# Patient Record
Sex: Male | Born: 1950 | Race: Black or African American | Hispanic: No | Marital: Married | Smoking: Never smoker
Health system: Southern US, Community
[De-identification: ages and names within clinical notes are randomized; demographics above are authoritative.]

## PROBLEM LIST (undated history)

## (undated) DIAGNOSIS — E669 Obesity, unspecified: Secondary | ICD-10-CM

## (undated) DIAGNOSIS — I10 Essential (primary) hypertension: Secondary | ICD-10-CM

## (undated) DIAGNOSIS — I2510 Atherosclerotic heart disease of native coronary artery without angina pectoris: Secondary | ICD-10-CM

## (undated) HISTORY — PX: OTHER SURGICAL HISTORY: SHX169

## (undated) HISTORY — DX: Essential (primary) hypertension: I10

## (undated) HISTORY — DX: Atherosclerotic heart disease of native coronary artery without angina pectoris: I25.10

## (undated) HISTORY — DX: Obesity, unspecified: E66.9

---

## 1998-07-14 ENCOUNTER — Ambulatory Visit (HOSPITAL_BASED_OUTPATIENT_CLINIC_OR_DEPARTMENT_OTHER): Admission: RE | Admit: 1998-07-14 | Discharge: 1998-07-14 | Payer: Self-pay | Admitting: *Deleted

## 2013-08-23 DIAGNOSIS — I10 Essential (primary) hypertension: Secondary | ICD-10-CM

## 2013-08-23 HISTORY — DX: Essential (primary) hypertension: I10

## 2015-07-07 ENCOUNTER — Encounter: Payer: BLUE CROSS/BLUE SHIELD | Attending: Family Medicine | Admitting: Dietician

## 2015-07-07 ENCOUNTER — Encounter: Payer: Self-pay | Admitting: Dietician

## 2015-07-07 VITALS — Ht 70.5 in | Wt 250.0 lb

## 2015-07-07 DIAGNOSIS — E669 Obesity, unspecified: Secondary | ICD-10-CM | POA: Insufficient documentation

## 2015-07-07 DIAGNOSIS — Z6835 Body mass index (BMI) 35.0-35.9, adult: Secondary | ICD-10-CM | POA: Diagnosis not present

## 2015-07-07 DIAGNOSIS — I10 Essential (primary) hypertension: Secondary | ICD-10-CM

## 2015-07-07 DIAGNOSIS — Z713 Dietary counseling and surveillance: Secondary | ICD-10-CM | POA: Diagnosis not present

## 2015-07-07 NOTE — Progress Notes (Signed)
  Medical Nutrition Therapy:  Appt start time: 0815 end time:  0915.   Assessment:  Primary concerns today: Patient is here alone.  He would like to learn how to control eating habits and live a healthier lifestyle.  Wife has diabetes and she speaks of how much "junk" food he eats.  Hx includes HTN.  He is at the highest weight and has increased over the last 3 years.  He is concerned because many of his relatives have diabetes and have lost multiple limbs.  He lives with his wife.  Patient shops and wife cooks.  Eats out about once a week.   He works for [employer] (first shift).  Preferred Learning Style:   No preference indicated   Learning Readiness:   Ready  MEDICATIONS: see list   DIETARY INTAKE: Wife tries to have patient eat like her.  Wife follows a diabetic diet.  She does not cook with salt, he uses "No Salt" with meals.  He buys canned foods.  He uses process meat, bologna, hot dogs, ham, spam.  No longer drinks soda except once a month.  24-hr recall:  B ( AM): Sausage or bacon and egg, cheese sandwich on [employer] with juice OR 1/2 pack NABS, and yogurt, juice  Snk ( AM):  L ( PM):  Fruit, carrots or celery with a lot of ranch or [place], IF he does NOT eat breakfast he has: hamburger with mayo or pimento cheese or fried bologna or PB&Jsandwich OR leftover chicken with a lot of dressing Snk ( PM):  Chips or cookies OR rare honeybun D ( PM):  Fish or chicken (baked- rare fried), salad or cooked vegetables, sweet potato with sugar and cinnamon or white potato "loaded" Snk ( PM): fruit  Beverages: water, OJ and grape juice, "a lot" of sweet tea, beer (1-2/week), wine (2 per week end- does not drink on the weekend)  Usual physical activity: Used to be very active but in the past 2-3 years has not been exercising.  He does his own yard work.  Estimated energy needs: 1600 calories 90 g protein  Progress Towards Goal(s):  In progress.   Nutritional Diagnosis:  NB-1.1  Food and nutrition-related knowledge deficit As related to healthy eating for weight reduction.  As evidenced by patient report.    Intervention:  Nutrition counseling/education on the DASH diet.  Tips provided for weight loss.  Be mindful of what you drink. Continue to be mindful when you eat.  "Am I hungry?"  "When am I full?" Breakfast, lunch, dinner daily. Smaller portions of meats and increased portions of vegetables. Be mindful to read labels and buy more unprocessed meat,fresh, and frozen foods. Snack options:  Low sodium popcorn, unsalted pretzels, almonds or other unsalted nut (1 oz), fruit Consider making a vegetable dip using plain greek yogurt and Mrs. Dash or onion powder, garlic powder, dill. Use only small amounts of regular dressing.  "be a spreader and not a glopper" At least 30 minutes of exercise most days of the week.  Teaching Method Utilized:  Visual Auditory  Handouts given during visit include:  My plate  Snack list  HTN/Dash diet  Barriers to learning/adherence to lifestyle change: none  Demonstrated degree of understanding via:  Teach Back   Monitoring/Evaluation:  Dietary intake, exercise, label reading, and body weight in 2 month(s).

## 2015-07-07 NOTE — Patient Instructions (Signed)
Be mindful of what you drink. Continue to be mindful when you eat.  "Am I hungry?"  "When am I full?" Breakfast, lunch, dinner daily. Smaller portions of meats and increased portions of vegetables. Be mindful to read labels and buy more unprocessed meat,fresh, and frozen foods. Snack options:  Low sodium popcorn, unsalted pretzels, almonds or other unsalted nut (1 oz), fruit Consider making a vegetable dip using plain greek yogurt and Mrs. Dash or onion powder, garlic powder, dill. Use only small amounts of regular dressing.  "be a spreader and not a glopper" At least 30 minutes of exercise most days of the week.

## 2015-09-08 ENCOUNTER — Ambulatory Visit: Payer: BLUE CROSS/BLUE SHIELD | Admitting: Dietician

## 2016-06-08 ENCOUNTER — Encounter: Payer: Self-pay | Admitting: Dietician

## 2016-06-08 ENCOUNTER — Encounter: Payer: BLUE CROSS/BLUE SHIELD | Attending: Family Medicine | Admitting: Dietician

## 2016-06-08 DIAGNOSIS — E6609 Other obesity due to excess calories: Secondary | ICD-10-CM

## 2016-06-08 DIAGNOSIS — I10 Essential (primary) hypertension: Secondary | ICD-10-CM | POA: Diagnosis not present

## 2016-06-08 DIAGNOSIS — Z6835 Body mass index (BMI) 35.0-35.9, adult: Secondary | ICD-10-CM

## 2016-06-08 NOTE — Progress Notes (Signed)
Medical Nutrition Therapy:  Appt start time: 1540 end time:  1610.   Assessment: 07/07/2015  Primary concerns today: Patient is here alone.  He would like to learn how to control eating habits and live a healthier lifestyle.  Wife has diabetes and she speaks of how much "junk" food he eats.  Hx includes HTN.  He is at the highest weight and has increased over the last 3 years.  He is concerned because many of his relatives have diabetes and have lost multiple limbs.  Weight 240 lbs  He lives with his wife.  Patient shops and wife cooks.  Eats out about once a week.   He works for [employer] (first shift).  06/08/16: Patient is here alone.  I saw him last about one month ago.  He did not come to the follow up scheduled after that appointment.  He states that he had been doing very well and lost 10 lbs but reverted to old habits and regained the weight.  Weight today is 248 lbs.  He states that he was eating more fruits and vegetables and had more energy.  He was also exercising regularly at that time.    Preferred Learning Style:   No preference indicated   Learning Readiness:   Ready  MEDICATIONS: see list   DIETARY INTAKE: Wife tries to have patient eat like her.  Wife follows a diabetic diet.  She does not cook with salt, he uses "No Salt" with meals.  He buys canned foods.  He uses process meat, bologna, hot dogs, ham, spam.  No longer drinks soda except once a month.  24-hr recall:  B (8:30-9:30 AM): yogurt and fruit or Sausage and egg Sandwich on [employer] bread Snk ( AM):  L ( PM):  Snacks on chips or cookies or other things that he brings from Snk ( PM):  [employer] burger and fries or sandwich or yogurt D ( PM):  Baked meat or occasional fried fish, sweet potato, vegetables OR weekends out to eat at [employer] Snk ( PM): fruit or yogurt or sherbet Beverages: water, OJ and grape juice, "a lot" of sweet tea, beer (1-2/week), wine (2 per week end- does not drink on the  weekend)  Usual physical activity: Used to be very active but in the past 2-3 years has not been exercising.  He does his own yard work.  He was more active after last appointment but did not continue.  He and his wife are now considering joining the [facility].  Estimated energy needs: 1600 calories 90 g protein  Progress Towards Goal(s):  In progress.   Nutritional Diagnosis:  NB-1.1 Food and nutrition-related knowledge deficit As related to healthy eating for weight reduction.  As evidenced by patient report.    Intervention:  Nutrition counseling/education on healthy eating.  Tips provided for weight loss.  Rethink what you drink.  Limit juice and sweet tea. Be sure to drink enough water.  Drink 1 bottle of water before work.  Aim for 2-3 bottles of water at work. Be mindful about your food choices.    "Am I hungry?"  "When am I full?" Eat slowly. Serve a small portion to begin with.  Finish this, wait 10 minutes and go back for seconds if you want. Watch the portion of salad dressing.  "Be a spreader not a glopper." Snack options:  Low sodium popcorn, unsalted pretzels, almonds or other unsalted nut (1 oz), fruit, raw veges, or protein bar Resume exercise.  Aim for 30 minutes most days of the week.  Teaching Method Utilized:  Visual Auditory  Handouts given during initial visit include:  My plate  Snack list  HTN/Dash diet  Barriers to learning/adherence to lifestyle change: none  Demonstrated degree of understanding via:  Teach Back   Monitoring/Evaluation:  Dietary intake, exercise, label reading, and body weight 6 months.

## 2016-06-08 NOTE — Patient Instructions (Addendum)
Rethink what you drink.  Limit juice and sweet tea. Be sure to drink enough water.  Drink 1 bottle of water before work.  Aim for 2-3 bottles of water at work. Be mindful about your food choices.    "Am I hungry?"  "When am I full?" Eat slowly. Serve a small portion to begin with.  Finish this, wait 10 minutes and go back for seconds if you want. Watch the portion of salad dressing.  "Be a spreader not a glopper." Snack options:  Low sodium popcorn, unsalted pretzels, almonds or other unsalted nut (1 oz), fruit, raw veges, or protein bar Resume exercise.  Aim for 30 minutes most days of the week.

## 2016-11-09 ENCOUNTER — Ambulatory Visit: Payer: BLUE CROSS/BLUE SHIELD | Admitting: Dietician

## 2018-06-01 DIAGNOSIS — Z23 Encounter for immunization: Secondary | ICD-10-CM | POA: Diagnosis not present

## 2018-08-28 DIAGNOSIS — Z136 Encounter for screening for cardiovascular disorders: Secondary | ICD-10-CM | POA: Diagnosis not present

## 2018-08-28 DIAGNOSIS — R7309 Other abnormal glucose: Secondary | ICD-10-CM | POA: Diagnosis not present

## 2018-08-28 DIAGNOSIS — I10 Essential (primary) hypertension: Secondary | ICD-10-CM | POA: Diagnosis not present

## 2018-09-01 DIAGNOSIS — Z125 Encounter for screening for malignant neoplasm of prostate: Secondary | ICD-10-CM | POA: Diagnosis not present

## 2018-09-01 DIAGNOSIS — Z23 Encounter for immunization: Secondary | ICD-10-CM | POA: Diagnosis not present

## 2018-09-01 DIAGNOSIS — R7301 Impaired fasting glucose: Secondary | ICD-10-CM | POA: Diagnosis not present

## 2018-09-01 DIAGNOSIS — N529 Male erectile dysfunction, unspecified: Secondary | ICD-10-CM | POA: Diagnosis not present

## 2018-09-01 DIAGNOSIS — Z0000 Encounter for general adult medical examination without abnormal findings: Secondary | ICD-10-CM | POA: Diagnosis not present

## 2018-09-01 DIAGNOSIS — Z1211 Encounter for screening for malignant neoplasm of colon: Secondary | ICD-10-CM | POA: Diagnosis not present

## 2018-09-01 DIAGNOSIS — D508 Other iron deficiency anemias: Secondary | ICD-10-CM | POA: Diagnosis not present

## 2018-09-01 DIAGNOSIS — I10 Essential (primary) hypertension: Secondary | ICD-10-CM | POA: Diagnosis not present

## 2018-09-11 DIAGNOSIS — R197 Diarrhea, unspecified: Secondary | ICD-10-CM | POA: Diagnosis not present

## 2018-09-11 DIAGNOSIS — I10 Essential (primary) hypertension: Secondary | ICD-10-CM | POA: Diagnosis not present

## 2018-09-11 DIAGNOSIS — Z86010 Personal history of colonic polyps: Secondary | ICD-10-CM | POA: Diagnosis not present

## 2018-09-29 DIAGNOSIS — D508 Other iron deficiency anemias: Secondary | ICD-10-CM | POA: Diagnosis not present

## 2018-09-29 DIAGNOSIS — R7301 Impaired fasting glucose: Secondary | ICD-10-CM | POA: Diagnosis not present

## 2018-09-29 DIAGNOSIS — Z125 Encounter for screening for malignant neoplasm of prostate: Secondary | ICD-10-CM | POA: Diagnosis not present

## 2018-09-29 DIAGNOSIS — Z0000 Encounter for general adult medical examination without abnormal findings: Secondary | ICD-10-CM | POA: Diagnosis not present

## 2018-10-03 DIAGNOSIS — Z86010 Personal history of colonic polyps: Secondary | ICD-10-CM | POA: Diagnosis not present

## 2018-11-09 ENCOUNTER — Encounter: Payer: Self-pay | Admitting: Internal Medicine

## 2018-11-09 ENCOUNTER — Telehealth: Payer: Self-pay | Admitting: Internal Medicine

## 2018-11-09 NOTE — Telephone Encounter (Signed)
A new hem appt has been scheduled for the pt to see Dr. Melton Alar on 3/31 at 1pm. Pt aware to arrive 15 minutes early. Letter mailed.

## 2018-11-21 ENCOUNTER — Encounter: Payer: BLUE CROSS/BLUE SHIELD | Admitting: Internal Medicine

## 2019-02-15 ENCOUNTER — Encounter (HOSPITAL_COMMUNITY): Payer: Self-pay

## 2019-02-15 ENCOUNTER — Other Ambulatory Visit: Payer: Self-pay

## 2019-02-15 ENCOUNTER — Emergency Department (HOSPITAL_COMMUNITY): Payer: Medicare HMO

## 2019-02-15 ENCOUNTER — Observation Stay (HOSPITAL_COMMUNITY)
Admission: EM | Admit: 2019-02-15 | Discharge: 2019-02-17 | Disposition: A | Discharge status: Home / Self Care | Payer: Medicare HMO | Attending: Cardiology | Admitting: Cardiology

## 2019-02-15 DIAGNOSIS — I214 Non-ST elevation (NSTEMI) myocardial infarction: Secondary | ICD-10-CM | POA: Diagnosis not present

## 2019-02-15 DIAGNOSIS — Z955 Presence of coronary angioplasty implant and graft: Secondary | ICD-10-CM

## 2019-02-15 DIAGNOSIS — I249 Acute ischemic heart disease, unspecified: Secondary | ICD-10-CM | POA: Diagnosis not present

## 2019-02-15 DIAGNOSIS — R778 Other specified abnormalities of plasma proteins: Secondary | ICD-10-CM

## 2019-02-15 DIAGNOSIS — R7303 Prediabetes: Secondary | ICD-10-CM | POA: Diagnosis present

## 2019-02-15 DIAGNOSIS — I10 Essential (primary) hypertension: Secondary | ICD-10-CM | POA: Diagnosis present

## 2019-02-15 DIAGNOSIS — E669 Obesity, unspecified: Secondary | ICD-10-CM | POA: Diagnosis not present

## 2019-02-15 DIAGNOSIS — R079 Chest pain, unspecified: Secondary | ICD-10-CM

## 2019-02-15 DIAGNOSIS — Z6832 Body mass index (BMI) 32.0-32.9, adult: Secondary | ICD-10-CM | POA: Insufficient documentation

## 2019-02-15 DIAGNOSIS — Z79899 Other long term (current) drug therapy: Secondary | ICD-10-CM | POA: Diagnosis not present

## 2019-02-15 DIAGNOSIS — Z7982 Long term (current) use of aspirin: Secondary | ICD-10-CM | POA: Insufficient documentation

## 2019-02-15 DIAGNOSIS — Z1159 Encounter for screening for other viral diseases: Secondary | ICD-10-CM | POA: Diagnosis not present

## 2019-02-15 DIAGNOSIS — Z7902 Long term (current) use of antithrombotics/antiplatelets: Secondary | ICD-10-CM | POA: Diagnosis not present

## 2019-02-15 DIAGNOSIS — R7989 Other specified abnormal findings of blood chemistry: Secondary | ICD-10-CM | POA: Diagnosis not present

## 2019-02-15 DIAGNOSIS — Z20828 Contact with and (suspected) exposure to other viral communicable diseases: Secondary | ICD-10-CM | POA: Diagnosis not present

## 2019-02-15 LAB — BASIC METABOLIC PANEL
Anion gap: 6 (ref 5–15)
BUN: 11 mg/dL (ref 8–23)
CO2: 22 mmol/L (ref 22–32)
Calcium: 8.6 mg/dL — ABNORMAL LOW (ref 8.9–10.3)
Chloride: 108 mmol/L (ref 98–111)
Creatinine, Ser: 0.89 mg/dL (ref 0.61–1.24)
GFR calc Af Amer: >60 mL/min (ref >60)
GFR calc non Af Amer: >60 mL/min (ref >60)
Glucose, Bld: 105 mg/dL — ABNORMAL HIGH (ref 70–99)
Sodium: 136 mmol/L (ref 135–145)

## 2019-02-15 LAB — CBC
HCT: 50.1 % (ref 39.0–52.0)
Hemoglobin: 14.7 g/dL (ref 13.0–17.0)
MCH: 24 pg — ABNORMAL LOW (ref 26.0–34.0)
MCHC: 29.3 g/dL — ABNORMAL LOW (ref 30.0–36.0)
MCV: 81.9 fL (ref 80.0–100.0)
Platelets: 236 10*3/uL (ref 150–400)
RBC: 6.12 MIL/uL — ABNORMAL HIGH (ref 4.22–5.81)
RDW: 16.1 % — ABNORMAL HIGH (ref 11.5–15.5)
WBC: 12.6 10*3/uL — ABNORMAL HIGH (ref 4.0–10.5)
nRBC: 0 % (ref 0.0–0.2)

## 2019-02-15 LAB — MAGNESIUM: Magnesium: 1.9 mg/dL (ref 1.7–2.4)

## 2019-02-15 LAB — CREATININE, SERUM
Creatinine, Ser: 0.66 mg/dL (ref 0.61–1.24)
GFR calc Af Amer: >60 mL/min (ref >60)
GFR calc non Af Amer: >60 mL/min (ref >60)

## 2019-02-15 LAB — TSH: TSH: 1.05 u[IU]/mL (ref 0.350–4.500)

## 2019-02-15 LAB — TROPONIN I (HIGH SENSITIVITY)
Troponin I (High Sensitivity): 132 ng/L (ref <18)
Troponin I (High Sensitivity): 364 ng/L (ref <18)
Troponin I (High Sensitivity): 773 ng/L (ref <18)

## 2019-02-15 MED ORDER — ENOXAPARIN SODIUM 40 MG/0.4ML ~~LOC~~ SOLN: 40.0000 mg | SUBCUTANEOUS | Status: DC

## 2019-02-15 MED ORDER — NITROGLYCERIN 0.4 MG SL SUBL
0.4000 mg | SUBLINGUAL_TABLET | SUBLINGUAL | Status: DC | PRN
  Administered 2019-02-15 (×2): 0.4 mg via SUBLINGUAL
  Filled 2019-02-15: qty 1

## 2019-02-15 MED ORDER — AMLODIPINE BESYLATE 5 MG PO TABS
5.0000 mg | ORAL_TABLET | Freq: Every day | ORAL | Status: DC
  Administered 2019-02-16 – 2019-02-17 (×2): 5 mg via ORAL
  Filled 2019-02-15 (×2): qty 1

## 2019-02-15 MED ORDER — HYDRALAZINE HCL 20 MG/ML IJ SOLN: 10.0000 mg | Freq: Four times a day (QID) | INTRAMUSCULAR | Status: DC | PRN

## 2019-02-15 MED ORDER — ASPIRIN 300 MG RE SUPP: 300.0000 mg | RECTAL | Status: DC

## 2019-02-15 MED ORDER — ACETAMINOPHEN 325 MG PO TABS
650.0000 mg | ORAL_TABLET | ORAL | Status: DC | PRN
  Administered 2019-02-15: 650 mg via ORAL
  Filled 2019-02-15: qty 2

## 2019-02-15 MED ORDER — ATORVASTATIN CALCIUM 40 MG PO TABS
40.0000 mg | ORAL_TABLET | Freq: Every day | ORAL | Status: DC
  Administered 2019-02-16 (×2): 40 mg via ORAL
  Filled 2019-02-15 (×3): qty 1

## 2019-02-15 MED ORDER — HEPARIN (PORCINE) 25000 UT/250ML-% IV SOLN
1200.0000 [IU]/h | INTRAVENOUS | Status: DC
  Filled 2019-02-15: qty 250

## 2019-02-15 MED ORDER — ASPIRIN EC 81 MG PO TBEC
81.0000 mg | DELAYED_RELEASE_TABLET | Freq: Every day | ORAL | Status: DC
  Administered 2019-02-16 – 2019-02-17 (×2): 81 mg via ORAL
  Filled 2019-02-15 (×3): qty 1

## 2019-02-15 MED ORDER — ASPIRIN 81 MG PO CHEW: 324.0000 mg | CHEWABLE_TABLET | ORAL | Status: DC

## 2019-02-15 MED ORDER — ZOLPIDEM TARTRATE 5 MG PO TABS: 5.0000 mg | ORAL_TABLET | Freq: Every evening | ORAL | Status: DC | PRN

## 2019-02-15 MED ORDER — LIDOCAINE VISCOUS HCL 2 % MT SOLN
15.0000 mL | Freq: Once | OROMUCOSAL | Status: AC
  Administered 2019-02-15: 12:00:00 15 mL via ORAL
  Filled 2019-02-15: qty 15

## 2019-02-15 MED ORDER — IBUPROFEN 200 MG PO TABS
600.0000 mg | ORAL_TABLET | Freq: Once | ORAL | Status: AC
  Administered 2019-02-15: 600 mg via ORAL
  Filled 2019-02-15: qty 3

## 2019-02-15 MED ORDER — ALUM & MAG HYDROXIDE-SIMETH 200-200-20 MG/5ML PO SUSP
30.0000 mL | Freq: Once | ORAL | Status: AC
  Administered 2019-02-15: 12:00:00 30 mL via ORAL
  Filled 2019-02-15: qty 30

## 2019-02-15 MED ORDER — FAMOTIDINE IN NACL 20-0.9 MG/50ML-% IV SOLN
20.0000 mg | Freq: Once | INTRAVENOUS | Status: AC
  Administered 2019-02-15: 20 mg via INTRAVENOUS
  Filled 2019-02-15: qty 50

## 2019-02-15 MED ORDER — SODIUM CHLORIDE 0.9% FLUSH
3.0000 mL | Freq: Once | INTRAVENOUS | Status: AC
  Administered 2019-02-15: 3 mL via INTRAVENOUS

## 2019-02-15 MED ORDER — ASPIRIN 81 MG PO CHEW
324.0000 mg | CHEWABLE_TABLET | Freq: Once | ORAL | Status: AC
  Administered 2019-02-15: 14:00:00 324 mg via ORAL
  Filled 2019-02-15: qty 4

## 2019-02-15 MED ORDER — HEPARIN BOLUS VIA INFUSION
4000.0000 [IU] | Freq: Once | INTRAVENOUS | Status: AC
  Administered 2019-02-15: 4000 [IU] via INTRAVENOUS
  Filled 2019-02-15: qty 4000

## 2019-02-15 MED ORDER — ONDANSETRON HCL 4 MG/2ML IJ SOLN
4.0000 mg | Freq: Four times a day (QID) | INTRAMUSCULAR | Status: DC | PRN
  Filled 2019-02-15: qty 2

## 2019-02-15 NOTE — ED Notes (Signed)
ED TO INPATIENT HANDOFF REPORT  ED Nurse Name and Phone #:  Darnell LevelMandi  S Name/Age/Gender Bruce LocksHorace W Spencer 68 y.o. male Room/Bed: WA19/WA19  Code Status   Code Status: Full Code  Home/SNF/Other Home Patient oriented to: self, place, time and situation Is this baseline? Yes   Triage Complete: Triage complete  Chief Complaint chest pain  Triage Note Pt states upper chest pains since yesterday. Pt the pain as nagging.    Allergies No Known Allergies  Level of Care/Admitting Diagnosis ED Disposition    ED Disposition Condition Comment   Admit  [facility] [[id number]]  Level of Care: Telemetry [5]  Admit to tele based on following criteria: Other see comments  Comments: chest pain  Covid Evaluation: N/A  Diagnosis: Chest pain at rest [[id number]][[id number]]  Admitting Physician: Hughie ClossPAHWANI, RAVI [[id number]]  Attending Physician: Hughie ClossPAHWANI, RAVI [[id number]]  PT Class (Do Not Modify): Observation [104]  PT Acc Code (Do Not Modify): Observation [10022]       B Medical/Surgery History Past Medical History:  Diagnosis Date  . Hypertension   . Obesity    History reviewed. No pertinent surgical history.   A IV Location/Drains/Wounds Patient Lines/Drains/Airways Status   Active Line/Drains/Airways    Name:   Placement date:   Placement time:   Site:   Days:   Peripheral IV 02/15/19 Right Hand   02/15/19    1036    Hand   less than 1          Intake/Output Last 24 hours No intake or output data in the 24 hours ending 02/15/19 1616  Labs/Imaging Results for orders placed or performed during the hospital encounter of 02/15/19 (from the past 48 hour(s))  CBC     Status: Abnormal   Collection Time: 02/15/19  9:47 AM  Result Value Ref Range   WBC 12.6 (H) 4.0 - 10.5 K/uL   RBC 6.12 (H) 4.22 - 5.81 MIL/uL   Hemoglobin 14.7 13.0 - 17.0 g/dL   HCT 29.550.1 62.139.0 - 30.852.0 %   MCV 81.9 80.0 - 100.0 fL   MCH 24.0 (L) 26.0 - 34.0 pg   MCHC 29.3 (L) 30.0 - 36.0 g/dL   RDW 65.716.1 (H) 84.611.5 - 96.215.5 %   Platelets 236 150 - 400 K/uL   nRBC 0.0 0.0 - 0.2 %    Comment: Performed at [facility], [address] [place], [place] [address]  Basic metabolic panel     Status: Abnormal   Collection Time: 02/15/19 11:00 AM  Result Value Ref Range   Sodium 136 135 - 145 mmol/L   Potassium  3.5 - 5.1 mmol/L    SPECIMEN HEMOLYZED. HEMOLYSIS MAY AFFECT INTEGRITY OF RESULTS.    Comment: ACCURACY OF RESULTS QUESTIONABLE. RECOMMEND RECOLLECT TO VERIFY.   Chloride 108 98 - 111 mmol/L   CO2 22 22 - 32 mmol/L   Glucose, Bld 105 (H) 70 - 99 mg/dL   BUN 11 8 - 23 mg/dL   Creatinine, Ser 1.320.89 0.61 - 1.24 mg/dL   Calcium 8.6 (L) 8.9 - 10.3 mg/dL   GFR calc non Af Amer >60 >60 mL/min   GFR calc Af Amer >60 >60 mL/min   Anion gap 6 5 - 15    Comment: Performed at [name], [address] [place], [place] [address]  Troponin I (High Sensitivity)     Status: Abnormal   Collection Time: 02/15/19 11:00 AM  Result Value Ref Range  Troponin I (High Sensitivity) 132 (HH) <18 ng/L    Comment: CRITICAL RESULT CALLED TO, READ BACK BY AND VERIFIED WITH: SMITH,T. RN AT 1335 02/15/19 MULLINS,T (NOTE) Elevated high sensitivity troponin I (hsTnI) values and significant  changes across serial measurements may suggest ACS but many other  chronic and acute conditions are known to elevate hsTnI results.  Refer to the Links section for chest pain algorithms and additional  guidance. Performed at [facility], [place] [address] [place], [place] [address]   Troponin I (High Sensitivity)     Status: Abnormal   Collection Time: 02/15/19  1:57 PM  Result Value Ref Range   Troponin I (High Sensitivity) 364.0 (HH) <18 ng/L    Comment: DELTA CHECK NOTED QUANTITY NOT SUFFICIENT TO REPEAT TEST CRITICAL VALUE NOTED.  VALUE IS CONSISTENT WITH PREVIOUSLY REPORTED AND CALLED VALUE. (NOTE) Elevated high sensitivity troponin I (hsTnI) values  and significant  changes across serial measurements may suggest ACS but many other  chronic and acute conditions are known to elevate hsTnI results.  Refer to the Links section for chest pain algorithms and additional  guidance. Performed at [facility], [place] [address], [place], [place] [address]    Dg Chest 2 View  Result Date: 02/15/2019 CLINICAL DATA:  New onset chest pain EXAM: CHEST - 2 VIEW COMPARISON:  09/07/2011 FINDINGS: Normal heart size and mediastinal contours. No acute infiltrate or edema. No effusion or pneumothorax. No acute osseous findings. IMPRESSION: No active cardiopulmonary disease. Electronically Signed   By: Monte Fantasia M.D.   On: 02/15/2019 11:17    Pending Labs Unresulted Labs (From admission, onward)    Start     Ordered   02/22/19 0500  Creatinine, serum  (enoxaparin (LOVENOX)    CrCl >/= 30 ml/min)  Weekly,   R    Comments: while on enoxaparin therapy    02/15/19 1557   02/16/19 2778  Basic metabolic panel  Tomorrow morning,   R     02/15/19 1557   02/16/19 0500  Lipid panel  Tomorrow morning,   R     02/15/19 1557   02/15/19 1602  Magnesium  Add-on,   AD     02/15/19 1605   02/15/19 1601  Creatinine, serum  (enoxaparin (LOVENOX)    CrCl >/= 30 ml/min)  Add-on,   AD    Comments: Baseline for enoxaparin therapy IF NOT ALREADY DRAWN.    02/15/19 1605   02/15/19 1557  HIV antibody (Routine Testing)  Once,   STAT     02/15/19 1557   02/15/19 1557  TSH  Once,   STAT     02/15/19 1557   02/15/19 1557  Troponin I (High Sensitivity)  STAT Now then every 2 hours,   STAT     02/15/19 1557   02/15/19 1405  Novel Coronavirus,NAA,(SEND-OUT TO REF LAB - TAT 24-48 hrs); Hosp Order  (Asymptomatic Patients Labs)  Once,   STAT    Question:  Rule Out  Answer:  Yes   02/15/19 1404          Vitals/Pain Today's Vitals   02/15/19 1530 02/15/19 1544 02/15/19 1545 02/15/19 1615  BP: (!) 169/93 (!) 169/93    Pulse: 68 67 72 74  Resp: 15 16  16  (!) 22  Temp:      TempSrc:      SpO2: 98% 97% 99% 97%  Weight:      Height:      PainSc:  Isolation Precautions No active isolations  Medications Medications  amLODipine (NORVASC) tablet 5 mg (5 mg Oral Not Given 02/15/19 1614)  aspirin chewable tablet 324 mg (324 mg Oral Not Given 02/15/19 1612)    Or  aspirin suppository 300 mg ( Rectal See Alternative 02/15/19 1612)  aspirin EC tablet 81 mg (has no administration in time range)  nitroGLYCERIN (NITROSTAT) SL tablet 0.4 mg (0.4 mg Sublingual Given 02/15/19 1616)  acetaminophen (TYLENOL) tablet 650 mg (650 mg Oral Given 02/15/19 1615)  ondansetron (ZOFRAN) injection 4 mg (has no administration in time range)  enoxaparin (LOVENOX) injection 40 mg (has no administration in time range)  atorvastatin (LIPITOR) tablet 40 mg (has no administration in time range)  zolpidem (AMBIEN) tablet 5 mg (has no administration in time range)  hydrALAZINE (APRESOLINE) injection 10 mg (has no administration in time range)  sodium chloride flush (NS) 0.9 % injection 3 mL (3 mLs Intravenous Given 02/15/19 1037)  famotidine (PEPCID) IVPB 20 mg premix (0 mg Intravenous Stopped 02/15/19 1209)  ibuprofen (ADVIL) tablet 600 mg (600 mg Oral Given 02/15/19 1138)  alum & mag hydroxide-simeth (MAALOX/MYLANTA) 200-200-20 MG/5ML suspension 30 mL (30 mLs Oral Given 02/15/19 1138)    And  lidocaine (XYLOCAINE) 2 % viscous mouth solution 15 mL (15 mLs Oral Given 02/15/19 1138)  aspirin chewable tablet 324 mg (324 mg Oral Given 02/15/19 1356)    Mobility walks Low fall risk   Focused Assessments Cardiac Assessment Handoff:    No results found for: CKTOTAL, CKMB, CKMBINDEX, TROPONINI No results found for: DDIMER Does the Patient currently have chest pain? Yes      R Recommendations: See Admitting Provider Note  Report given to:   Additional Notes:

## 2019-02-15 NOTE — Progress Notes (Signed)
ANTICOAGULATION CONSULT NOTE - Initial Consult  Pharmacy Consult for IV heparin Indication: chest pain/ACS  No Known Allergies  Patient Measurements: Height: 5\' 11"  (180.3 cm) Weight: 222 lb (100.7 kg) IBW/kg (Calculated) : 75.3 Heparin Dosing Weight: 96.1  Vital Signs: Temp: 97.8 F (36.6 C) (06/25 1000) Temp Source: Oral (06/25 1000) BP: 171/83 (06/25 1730) Pulse Rate: 62 (06/25 1730)  Labs: Recent Labs    02/15/19 0947 02/15/19 1100  HGB 14.7  --   HCT 50.1  --   PLT 236  --   CREATININE  --  0.89    Estimated Creatinine Clearance: 100.1 mL/min (by C-G formula based on SCr of 0.89 mg/dL).   Medical History: Past Medical History:  Diagnosis Date  . Hypertension   . Obesity     Medications:  Scheduled:  . amLODipine  5 mg Oral Daily  . aspirin  324 mg Oral NOW   Or  . aspirin  300 mg Rectal NOW  . [START ON 02/16/2019] aspirin EC  81 mg Oral Daily  . atorvastatin  40 mg Oral q1800  . enoxaparin (LOVENOX) injection  40 mg Subcutaneous Q24H   Infusions:    Assessment: 68 yo male with ACS to start IV heparin per pharmacy dosing. Baseline labs drawn. No anticoag meds being taken PTA  Goal of Therapy:  Heparin level 0.3-0.7 units/ml Monitor platelets by anticoagulation protocol: Yes   Plan:  1) IV heparin 4000 unit bolus then 2) IV heparin infusion rate of 1200 units/hr 3) Check heparin level 6 hours after start of IV heparin 4) Daily CBC and heparin level   Kara Mead 02/15/2019,6:10 PM

## 2019-02-15 NOTE — ED Notes (Signed)
Attempted to call 4W but no answer

## 2019-02-15 NOTE — ED Notes (Signed)
Called 4W and was told to call back in 10 minutes to give report.

## 2019-02-15 NOTE — ED Triage Notes (Signed)
Pt states upper chest pains since yesterday. Pt the pain as nagging.

## 2019-02-15 NOTE — ED Provider Notes (Signed)
[facility]-EMERGENCY DEPT Provider Note   CSN: [id number] Arrival date & time: 02/15/19  [id number]     History   Chief Complaint Chief Complaint  Patient presents with  . Chest Pain    HPI Bruce Spencer is a 68 y.o. male.     HPI   67yM with CP. Describes a "nagging" pain in his upper chest. Onset yesterday. Just thought he strained his chest. Improved but not resolved prior to going to bed. Had again this morning and worsening when went to work so came to the ED. Denies any other symptoms with it. No known CAD. HX of HTN.   Past Medical History:  Diagnosis Date  . Hypertension   . Obesity     There are no active problems to display for this patient.   History reviewed. No pertinent surgical history.      Home Medications    Prior to Admission medications   Medication Sig Start Date End Date Taking? Authorizing Provider  amLODipine (NORVASC) 5 MG tablet Take 5 mg by mouth daily. 01/26/19  Yes [provider]  Multiple Vitamin (MULTIVITAMIN WITH MINERALS) TABS tablet Take 1 tablet by mouth daily.   Yes [provider]  OVER THE COUNTER MEDICATION Take 1 capsule by mouth 3 (three) times a week. Super Beta Prostate   Yes [provider]    Family History No family history on file.  Social History Social History   Tobacco Use  . Smoking status: Never Smoker  . Smokeless tobacco: Never Used  Substance Use Topics  . Alcohol use: Yes    Frequency: Never    Comment: socially  . Drug use: Never     Allergies   Patient has no known allergies.   Review of Systems Review of Systems  All systems reviewed and negative, other than as noted in HPI.  Physical Exam Updated Vital Signs BP (!) 155/87   Pulse 63   Temp 97.8 F (36.6 C) (Oral)   Resp 11   Ht 5\' 11"  (1.803 m)   Wt 100.7 kg   SpO2 99%   BMI 30.96 kg/m   Physical Exam Vitals signs and nursing note reviewed.  Constitutional:      General: He is  not in acute distress.    Appearance: He is well-developed.  HENT:     Head: Normocephalic and atraumatic.  Eyes:     General:        Right eye: No discharge.        Left eye: No discharge.     Conjunctiva/sclera: Conjunctivae normal.  Neck:     Musculoskeletal: Neck supple.  Cardiovascular:     Rate and Rhythm: Normal rate and regular rhythm.     Heart sounds: Normal heart sounds. No murmur. No friction rub. No gallop.   Pulmonary:     Effort: Pulmonary effort is normal. No respiratory distress.     Breath sounds: Normal breath sounds.  Abdominal:     General: There is no distension.     Palpations: Abdomen is soft.     Tenderness: There is no abdominal tenderness.  Musculoskeletal:        General: No tenderness.     Comments: Lower extremities symmetric as compared to each other. No calf tenderness. Negative Homan's. No palpable cords.   Skin:    General: Skin is warm and dry.  Neurological:     Mental Status: He is alert.  Psychiatric:  Behavior: Behavior normal.        Thought Content: Thought content normal.      ED Treatments / Results  Labs (all labs ordered are listed, but only abnormal results are displayed) Labs Reviewed  CBC - Abnormal; Notable for the following components:      Result Value   WBC 12.6 (*)    RBC 6.12 (*)    MCH 24.0 (*)    MCHC 29.3 (*)    RDW 16.1 (*)    All other components within normal limits  BASIC METABOLIC PANEL - Abnormal; Notable for the following components:   Glucose, Bld 105 (*)    Calcium 8.6 (*)    All other components within normal limits  TROPONIN I (HIGH SENSITIVITY) - Abnormal; Notable for the following components:   Troponin I (High Sensitivity) 132 (*)    All other components within normal limits  TROPONIN I (HIGH SENSITIVITY)    EKG EKG Interpretation  Date/Time:  Thursday February 15 2019 09:59:06 EDT Ventricular Rate:  72 PR Interval:    QRS Duration: 87 QT Interval:  377 QTC Calculation: 413 R  Axis:   70 Text Interpretation:  Sinus rhythm Baseline wander No old tracing to compare Confirmed by Virgel Manifold [phone]) on 02/15/2019 10:05:14 AM   Radiology Dg Chest 2 View  Result Date: 02/15/2019 CLINICAL DATA:  New onset chest pain EXAM: CHEST - 2 VIEW COMPARISON:  09/07/2011 FINDINGS: Normal heart size and mediastinal contours. No acute infiltrate or edema. No effusion or pneumothorax. No acute osseous findings. IMPRESSION: No active cardiopulmonary disease. Electronically Signed   By: Monte Fantasia M.D.   On: 02/15/2019 11:17    Procedures Procedures (including critical care time)  Medications Ordered in ED Medications  aspirin chewable tablet 324 mg (has no administration in time range)  sodium chloride flush (NS) 0.9 % injection 3 mL (3 mLs Intravenous Given 02/15/19 1037)  famotidine (PEPCID) IVPB 20 mg premix (0 mg Intravenous Stopped 02/15/19 1209)  ibuprofen (ADVIL) tablet 600 mg (600 mg Oral Given 02/15/19 1138)  alum & mag hydroxide-simeth (MAALOX/MYLANTA) 200-200-20 MG/5ML suspension 30 mL (30 mLs Oral Given 02/15/19 1138)    And  lidocaine (XYLOCAINE) 2 % viscous mouth solution 15 mL (15 mLs Oral Given 02/15/19 1138)     Initial Impression / Assessment and Plan / ED Course  I have reviewed the triage vital signs and the nursing notes.  Pertinent labs & imaging results that were available during my care of the patient were reviewed by me and considered in my medical decision making (see chart for details).        67yM with CP. Seems fairly atypical for me for ACS but troponin is significantly elevated. ASA. Admission for further evaluation.   Final Clinical Impressions(s) / ED Diagnoses   Final diagnoses:  Chest pain, unspecified type  Elevated troponin    ED Discharge Orders    None       Virgel Manifold, MD 02/18/19 1437

## 2019-02-15 NOTE — ED Notes (Addendum)
Per Dr Doristine Bosworth, wait for the 3rd troponin and if it is still trending up(higher than the 2nd troponin), call Hospitalist to consult cardiology

## 2019-02-15 NOTE — H&P (Signed)
History and Physical    Bruce Spencer Barkow NWG:[id number] DOB: 08/29/1950 DOA: 02/15/2019  PCP: Jarrett SohoWharton, Courtney, PA-C  Patient coming from: Home  I have personally briefly reviewed patient's [facility]  Chief Complaint: Chest pain  HPI: Bruce Spencer Dena is a 68 y.o. male with medical history significant of hypertension presented to ER with a complaint of chest pain.  According to patient, the pain started yesterday in the evening at rest at home.  Patient works at [employer] in [employer] and carries heavy loads every day how ever he did not have any pain at the work but the pain started at home in the evening.  He describes pain as " nagging", 5 out of 10, constant with no aggravating or relieving factor and no radiation.  He also has some nausea associated with this.  Denies any dizziness, diaphoresis, shortness of breath or any other complaint.  No recent travel or sick contact.  Upon further questioning, he tells me that he eats a lot of spicy food and pickles every day along with couple of bottles of caffeine tea.  Does not know if he has any GERD or not.  ED Course: Upon arrival to the emergency department, he was hemodynamically stable.  EKG shows normal sinus rhythm with no ST-T wave changes.  Basic CBC and CMP was normal.  High-sensitivity troponin was slightly elevated at 130.  EDP discussed the case with on-call cardiologist Dr. Antoine PocheHochrein who had recommended admission for observation and trending troponins but no therapeutic anticoagulation.  Cardiologist also recommended consulting cardiology if needed.  Hospital service was then consulted to admit the patient for further management.  Review of Systems: As per HPI otherwise 10 point review of systems negative.    Past Medical History:  Diagnosis Date  . Hypertension   . Obesity     History reviewed. No pertinent surgical history.   reports that he has never smoked. He has never used  smokeless tobacco. He reports current alcohol use. He reports that he does not use drugs.  No Known Allergies  No family history on file.  Prior to Admission medications   Medication Sig Start Date End Date Taking? Authorizing Provider  amLODipine (NORVASC) 5 MG tablet Take 5 mg by mouth daily. 01/26/19  Yes [provider]  Multiple Vitamin (MULTIVITAMIN WITH MINERALS) TABS tablet Take 1 tablet by mouth daily.   Yes [provider]  OVER THE COUNTER MEDICATION Take 1 capsule by mouth 3 (three) times a week. Super Beta Prostate   Yes [provider]    Physical Exam: Vitals:   02/15/19 1230 02/15/19 1300 02/15/19 1330 02/15/19 1345  BP: (!) 171/92 (!) 158/87 (!) 147/80   Pulse:  66 62 63  Resp: 13 (!) 23 14 (!) 23  Temp:      TempSrc:      SpO2:  100% 96% 96%  Weight:      Height:        Constitutional: NAD, calm, comfortable Vitals:   02/15/19 1230 02/15/19 1300 02/15/19 1330 02/15/19 1345  BP: (!) 171/92 (!) 158/87 (!) 147/80   Pulse:  66 62 63  Resp: 13 (!) 23 14 (!) 23  Temp:      TempSrc:      SpO2:  100% 96% 96%  Weight:      Height:       Eyes: PERRL, lids and conjunctivae normal ENMT: Mucous membranes are moist. Posterior pharynx clear  of any exudate or lesions.Normal dentition.  Neck: normal, supple, no masses, no thyromegaly Respiratory: clear to auscultation bilaterally, no wheezing, no crackles. Normal respiratory effort. No accessory muscle use.  Cardiovascular: Regular rate and rhythm, no murmurs / rubs / gallops. No extremity edema. 2+ pedal pulses. No carotid bruits.  Abdomen: no tenderness, no masses palpated. No hepatosplenomegaly. Bowel sounds positive.  Musculoskeletal: no clubbing / cyanosis. No joint deformity upper and lower extremities. Good ROM, no contractures. Normal muscle tone.  Skin: no rashes, lesions, ulcers. No induration Neurologic: CN 2-12 grossly intact. Sensation intact, DTR normal. Strength 5/5 in all 4.   Psychiatric: Normal judgment and insight. Alert and oriented x 3. Normal mood.    Labs on Admission: I have personally reviewed following labs and imaging studies  CBC: Recent Labs  Lab 02/15/19 0947  WBC 12.6*  HGB 14.7  HCT 50.1  MCV 81.9  PLT 236   Basic Metabolic Panel: Recent Labs  Lab 02/15/19 1100  NA 136  K SPECIMEN HEMOLYZED. HEMOLYSIS MAY AFFECT INTEGRITY OF RESULTS.  CL 108  CO2 22  GLUCOSE 105*  BUN 11  CREATININE 0.89  CALCIUM 8.6*   GFR: Estimated Creatinine Clearance: 100.1 mL/min (by C-G formula based on SCr of 0.89 mg/dL). Liver Function Tests: No results for input(s): AST, ALT, ALKPHOS, BILITOT, PROT, ALBUMIN in the last 168 hours. No results for input(s): LIPASE, AMYLASE in the last 168 hours. No results for input(s): AMMONIA in the last 168 hours. Coagulation Profile: No results for input(s): INR, PROTIME in the last 168 hours. Cardiac Enzymes: No results for input(s): CKTOTAL, CKMB, CKMBINDEX, TROPONINI in the last 168 hours. BNP (last 3 results) No results for input(s): PROBNP in the last 8760 hours. HbA1C: No results for input(s): HGBA1C in the last 72 hours. CBG: No results for input(s): GLUCAP in the last 168 hours. Lipid Profile: No results for input(s): CHOL, HDL, LDLCALC, TRIG, CHOLHDL, LDLDIRECT in the last 72 hours. Thyroid Function Tests: No results for input(s): TSH, T4TOTAL, FREET4, T3FREE, THYROIDAB in the last 72 hours. Anemia Panel: No results for input(s): VITAMINB12, FOLATE, FERRITIN, TIBC, IRON, RETICCTPCT in the last 72 hours. Urine analysis: No results found for: COLORURINE, APPEARANCEUR, LABSPEC, PHURINE, GLUCOSEU, HGBUR, BILIRUBINUR, KETONESUR, PROTEINUR, UROBILINOGEN, NITRITE, LEUKOCYTESUR  Radiological Exams on Admission: Dg Chest 2 View  Result Date: 02/15/2019 CLINICAL DATA:  New onset chest pain EXAM: CHEST - 2 VIEW COMPARISON:  09/07/2011 FINDINGS: Normal heart size and mediastinal contours. No acute infiltrate  or edema. No effusion or pneumothorax. No acute osseous findings. IMPRESSION: No active cardiopulmonary disease. Electronically Signed   By: Marnee SpringJonathon  Watts M.D.   On: 02/15/2019 11:17    EKG: Independently reviewed.  Normal sinus rhythm with no ST T wave changes.  Assessment/Plan Active Problems:   Chest pain at rest   Essential hypertension    Chest pain at rest: Based on the history, his chest pain seems to be atypical however due to age and risk factor of being hypertension, he may have coronary ischemia so we will follow cardiac enzymes and monitor him on telemetry.  I will check lipid profile in the morning.  Start him on atorvastatin 40 now.  I have also scheduled him for nuclear stress test in the morning in case he rules out.  Consider consulting cardiology if needed.  Hypertension: Slightly limited.  Resume amlodipine.  Add PRN hydralazine.  DVT prophylaxis: Lovenox Code Status: Full code Family Communication: None present at bedside.  Patient alert and oriented.  I discussed plan of admission with him in detail. Disposition Plan: Likely home tomorrow. Consults called: None.  EDP discussed case with cardiology over the phone.  Will need official consultation if required. Admission status: Observation   Darliss Cheney MD [facility] Pager [phone]  If 7PM-7AM, please contact night-coverage [email or web address] Password [facility]  02/15/2019, 3:19 PM

## 2019-02-15 NOTE — Progress Notes (Signed)
Pt pended to [facility] ICU/SD as a stepdown.  When reviewing chart, admitting order is for telemetry, not Stepdown.  Dayshift charge Darrick Meigs) called and talked with charge in ED to request clarification of admit order.  If MD wants stepdown will need an order for stepdown prior to approving a bed assignment.  Felecia Jan ICU/SD Care Coordinator / Rapid Response Nurse Rapid Response Number:  [phone]

## 2019-02-15 NOTE — ED Notes (Signed)
ED TO INPATIENT HANDOFF REPORT  ED Nurse Name and Phone #: Hilaria OtaKatie  S Name/Age/Gender Bruce Spencer 68 y.o. male Room/Bed: WA19/WA19  Code Status   Code Status: Full Code  Home/SNF/Other Home Patient oriented to: self, place, time and situation Is this baseline? Yes   Triage Complete: Triage complete  Chief Complaint chest pain  Triage Note Pt states upper chest pains since yesterday. Pt the pain as nagging.    Allergies No Known Allergies  Level of Care/Admitting Diagnosis ED Disposition    ED Disposition Condition Comment   Admit  [facility] [[id number]]  Level of Care: Telemetry [5]  Admit to tele based on following criteria: Monitor for Ischemic changes  Covid Evaluation: Screening Protocol (No Symptoms)  Diagnosis: Chest pain [604540][744799]  Admitting Physician: Pearson GrippeKIM, JAMES [[id number]]  Attending Physician: Pearson GrippeKIM, JAMES [[id number]]  PT Class (Do Not Modify): Observation [104]  PT Acc Code (Do Not Modify): Observation [10022]       B Medical/Surgery History Past Medical History:  Diagnosis Date  . Hypertension   . Obesity    History reviewed. No pertinent surgical history.   A IV Location/Drains/Wounds Patient Lines/Drains/Airways Status   Active Line/Drains/Airways    Name:   Placement date:   Placement time:   Site:   Days:   Peripheral IV 02/15/19 Right Antecubital   02/15/19    1925    Antecubital   less than 1          Intake/Output Last 24 hours No intake or output data in the 24 hours ending 02/15/19 2121  Labs/Imaging Results for orders placed or performed during the hospital encounter of 02/15/19 (from the past 48 hour(s))  CBC     Status: Abnormal   Collection Time: 02/15/19  9:47 AM  Result Value Ref Range   WBC 12.6 (H) 4.0 - 10.5 K/uL   RBC 6.12 (H) 4.22 - 5.81 MIL/uL   Hemoglobin 14.7 13.0 - 17.0 g/dL   HCT 98.150.1 19.139.0 - 47.852.0 %   MCV 81.9 80.0 - 100.0 fL   MCH 24.0 (L) 26.0 - 34.0 pg   MCHC 29.3 (L) 30.0 -  36.0 g/dL   RDW 29.516.1 (H) 62.111.5 - 30.815.5 %   Platelets 236 150 - 400 K/uL   nRBC 0.0 0.0 - 0.2 %    Comment: Performed at [facility], [address] [place], [place] [address]  Basic metabolic panel     Status: Abnormal   Collection Time: 02/15/19 11:00 AM  Result Value Ref Range   Sodium 136 135 - 145 mmol/L   Potassium  3.5 - 5.1 mmol/L    SPECIMEN HEMOLYZED. HEMOLYSIS MAY AFFECT INTEGRITY OF RESULTS.    Comment: ACCURACY OF RESULTS QUESTIONABLE. RECOMMEND RECOLLECT TO VERIFY.   Chloride 108 98 - 111 mmol/L   CO2 22 22 - 32 mmol/L   Glucose, Bld 105 (H) 70 - 99 mg/dL   BUN 11 8 - 23 mg/dL   Creatinine, Ser 6.960.89 0.61 - 1.24 mg/dL   Calcium 8.6 (L) 8.9 - 10.3 mg/dL   GFR calc non Af Amer >60 >60 mL/min   GFR calc Af Amer >60 >60 mL/min   Anion gap 6 5 - 15    Comment: Performed at [facility], [address] [place], [place] [address]  Troponin I (High Sensitivity)     Status: Abnormal   Collection Time: 02/15/19 11:00 AM  Result Value Ref Range   Troponin  I (High Sensitivity) 132 (HH) <18 ng/L    Comment: CRITICAL RESULT CALLED TO, READ BACK BY AND VERIFIED WITH: SMITH,T. RN AT 1335 02/15/19 MULLINS,T (NOTE) Elevated high sensitivity troponin I (hsTnI) values and significant  changes across serial measurements may suggest ACS but many other  chronic and acute conditions are known to elevate hsTnI results.  Refer to the Links section for chest pain algorithms and additional  guidance. Performed at [facility], [address] [place], [place] [address]   Troponin I (High Sensitivity)     Status: Abnormal   Collection Time: 02/15/19  1:57 PM  Result Value Ref Range   Troponin I (High Sensitivity) 364.0 (HH) <18 ng/L    Comment: DELTA CHECK NOTED QUANTITY NOT SUFFICIENT TO REPEAT TEST CRITICAL VALUE NOTED.  VALUE IS CONSISTENT WITH PREVIOUSLY REPORTED AND CALLED VALUE. (NOTE) Elevated high sensitivity troponin I  (hsTnI) values and significant  changes across serial measurements may suggest ACS but many other  chronic and acute conditions are known to elevate hsTnI results.  Refer to the Links section for chest pain algorithms and additional  guidance. Performed at [facility], [address] [place], [place] [address]   Troponin I (High Sensitivity)     Status: Abnormal   Collection Time: 02/15/19  4:48 PM  Result Value Ref Range   Troponin I (High Sensitivity) 773.0 (HH) <18 ng/L    Comment: DELTA CHECK NOTED REPEATED TO VERIFY CRITICAL VALUE NOTED.  VALUE IS CONSISTENT WITH PREVIOUSLY REPORTED AND CALLED VALUE. (NOTE) Elevated high sensitivity troponin I (hsTnI) values and significant  changes across serial measurements may suggest ACS but many other  chronic and acute conditions are known to elevate hsTnI results.  Refer to the Links section for chest pain algorithms and additional  guidance. Performed at [facility], [address] [place], [place] [address]   Creatinine, serum     Status: None   Collection Time: 02/15/19  4:48 PM  Result Value Ref Range   Creatinine, Ser 0.66 0.61 - 1.24 mg/dL   GFR calc non Af Amer >60 >60 mL/min   GFR calc Af Amer >60 >60 mL/min    Comment: Performed at [facility], [address] [place], [place] [address]  Magnesium     Status: None   Collection Time: 02/15/19  4:48 PM  Result Value Ref Range   Magnesium 1.9 1.7 - 2.4 mg/dL    Comment: Performed at [facility], [address] [place], [place] [address]  TSH     Status: None   Collection Time: 02/15/19  6:44 PM  Result Value Ref Range   TSH 1.050 0.350 - 4.500 uIU/mL    Comment: Performed by a 3rd Generation assay with a functional sensitivity of <=0.01 uIU/mL. Performed at [facility], [address] [place], [place] [address]    Dg Chest 2 View  Result Date: 02/15/2019 CLINICAL  DATA:  New onset chest pain EXAM: CHEST - 2 VIEW COMPARISON:  09/07/2011 FINDINGS: Normal heart size and mediastinal contours. No acute infiltrate or edema. No effusion or pneumothorax. No acute osseous findings. IMPRESSION: No active cardiopulmonary disease. Electronically Signed   By: Marnee SpringJonathon  Watts M.D.   On: 02/15/2019 11:17    Pending Labs Unresulted Labs (From admission, onward)    Start     Ordered   02/16/19 0500  Basic metabolic panel  Tomorrow morning,   R  02/15/19 1557   02/16/19 0500  Lipid panel  Tomorrow morning,   R     02/15/19 1557   02/16/19 0500  CBC  Daily,   R     02/15/19 1813   02/16/19 0200  Heparin level (unfractionated)  Once-Timed,   STAT     02/15/19 2109   02/15/19 1557  HIV antibody (Routine Testing)  Once,   STAT     02/15/19 1557   02/15/19 1405  Novel Coronavirus,NAA,(SEND-OUT TO REF LAB - TAT 24-48 hrs); Hosp Order  (Asymptomatic Patients Labs)  Once,   STAT    Question:  Rule Out  Answer:  Yes   02/15/19 1404          Vitals/Pain Today's Vitals   02/15/19 1710 02/15/19 1730 02/15/19 1930 02/15/19 1945  BP:  (!) 171/83 133/75   Pulse:  62  63  Resp:  20  14  Temp:      TempSrc:      SpO2:  95%  98%  Weight:      Height:      PainSc: 0-No pain       Isolation Precautions No active isolations  Medications Medications  amLODipine (NORVASC) tablet 5 mg (5 mg Oral Not Given 02/15/19 1614)  aspirin chewable tablet 324 mg (324 mg Oral Not Given 02/15/19 1612)    Or  aspirin suppository 300 mg ( Rectal See Alternative 02/15/19 1612)  aspirin EC tablet 81 mg (has no administration in time range)  nitroGLYCERIN (NITROSTAT) SL tablet 0.4 mg (0.4 mg Sublingual Given 02/15/19 1616)  acetaminophen (TYLENOL) tablet 650 mg (650 mg Oral Given 02/15/19 1615)  ondansetron (ZOFRAN) injection 4 mg (has no administration in time range)  atorvastatin (LIPITOR) tablet 40 mg (has no administration in time range)  zolpidem (AMBIEN) tablet 5 mg (has no  administration in time range)  hydrALAZINE (APRESOLINE) injection 10 mg (has no administration in time range)  heparin ADULT infusion 100 units/mL (25000 units/24mL sodium chloride 0.45%) (1,200 Units/hr Intravenous Incomplete 02/15/19 1944)  sodium chloride flush (NS) 0.9 % injection 3 mL (3 mLs Intravenous Given 02/15/19 1037)  famotidine (PEPCID) IVPB 20 mg premix (0 mg Intravenous Stopped 02/15/19 1209)  ibuprofen (ADVIL) tablet 600 mg (600 mg Oral Given 02/15/19 1138)  alum & mag hydroxide-simeth (MAALOX/MYLANTA) 200-200-20 MG/5ML suspension 30 mL (30 mLs Oral Given 02/15/19 1138)    And  lidocaine (XYLOCAINE) 2 % viscous mouth solution 15 mL (15 mLs Oral Given 02/15/19 1138)  aspirin chewable tablet 324 mg (324 mg Oral Given 02/15/19 1356)  heparin bolus via infusion 4,000 Units (4,000 Units Intravenous Bolus from Bag 02/15/19 1942)    Mobility walks Low fall risk   Focused Assessments NA   R Recommendations: See Admitting Provider Note  Report given to:   Additional Notes: NA

## 2019-02-16 ENCOUNTER — Other Ambulatory Visit: Payer: Self-pay

## 2019-02-16 ENCOUNTER — Encounter (HOSPITAL_COMMUNITY): Payer: Self-pay | Admitting: Physician Assistant

## 2019-02-16 ENCOUNTER — Encounter (HOSPITAL_COMMUNITY)
Admission: EM | Disposition: A | Discharge status: Home / Self Care | Payer: Self-pay | Source: Home / Self Care | Attending: Emergency Medicine

## 2019-02-16 DIAGNOSIS — I2510 Atherosclerotic heart disease of native coronary artery without angina pectoris: Secondary | ICD-10-CM

## 2019-02-16 DIAGNOSIS — I214 Non-ST elevation (NSTEMI) myocardial infarction: Secondary | ICD-10-CM | POA: Diagnosis not present

## 2019-02-16 DIAGNOSIS — Z79899 Other long term (current) drug therapy: Secondary | ICD-10-CM | POA: Diagnosis not present

## 2019-02-16 DIAGNOSIS — R079 Chest pain, unspecified: Secondary | ICD-10-CM | POA: Diagnosis not present

## 2019-02-16 DIAGNOSIS — R7303 Prediabetes: Secondary | ICD-10-CM | POA: Diagnosis not present

## 2019-02-16 DIAGNOSIS — I249 Acute ischemic heart disease, unspecified: Secondary | ICD-10-CM | POA: Diagnosis not present

## 2019-02-16 DIAGNOSIS — I10 Essential (primary) hypertension: Secondary | ICD-10-CM | POA: Diagnosis not present

## 2019-02-16 DIAGNOSIS — Z7902 Long term (current) use of antithrombotics/antiplatelets: Secondary | ICD-10-CM | POA: Diagnosis not present

## 2019-02-16 DIAGNOSIS — E669 Obesity, unspecified: Secondary | ICD-10-CM | POA: Diagnosis not present

## 2019-02-16 DIAGNOSIS — Z7982 Long term (current) use of aspirin: Secondary | ICD-10-CM | POA: Diagnosis not present

## 2019-02-16 DIAGNOSIS — Z1159 Encounter for screening for other viral diseases: Secondary | ICD-10-CM | POA: Diagnosis not present

## 2019-02-16 HISTORY — PX: LEFT HEART CATH AND CORONARY ANGIOGRAPHY: CATH118249

## 2019-02-16 HISTORY — PX: CORONARY STENT INTERVENTION: CATH118234

## 2019-02-16 LAB — BASIC METABOLIC PANEL
Anion gap: 11 (ref 5–15)
BUN: 9 mg/dL (ref 8–23)
CO2: 22 mmol/L (ref 22–32)
Calcium: 8.5 mg/dL — ABNORMAL LOW (ref 8.9–10.3)
Chloride: 105 mmol/L (ref 98–111)
Creatinine, Ser: 0.76 mg/dL (ref 0.61–1.24)
GFR calc Af Amer: >60 mL/min (ref >60)
GFR calc non Af Amer: >60 mL/min (ref >60)
Glucose, Bld: 125 mg/dL — ABNORMAL HIGH (ref 70–99)
Potassium: 3.5 mmol/L (ref 3.5–5.1)
Sodium: 138 mmol/L (ref 135–145)

## 2019-02-16 LAB — CBC
HCT: 47.1 % (ref 39.0–52.0)
Hemoglobin: 14.4 g/dL (ref 13.0–17.0)
MCH: 24.7 pg — ABNORMAL LOW (ref 26.0–34.0)
MCHC: 30.6 g/dL (ref 30.0–36.0)
MCV: 80.9 fL (ref 80.0–100.0)
Platelets: 169 10*3/uL (ref 150–400)
RBC: 5.82 MIL/uL — ABNORMAL HIGH (ref 4.22–5.81)
RDW: 15.9 % — ABNORMAL HIGH (ref 11.5–15.5)
WBC: 8 10*3/uL (ref 4.0–10.5)
nRBC: 0 % (ref 0.0–0.2)

## 2019-02-16 LAB — LIPID PANEL
Cholesterol: 144 mg/dL (ref 0–200)
HDL: 48 mg/dL (ref >40)
LDL Cholesterol: 79 mg/dL (ref 0–99)
Total CHOL/HDL Ratio: 3 RATIO
Triglycerides: 83 mg/dL (ref <150)
VLDL: 17 mg/dL (ref 0–40)

## 2019-02-16 LAB — HIV ANTIBODY (ROUTINE TESTING W REFLEX): HIV Screen 4th Generation wRfx: NONREACTIVE

## 2019-02-16 LAB — POCT ACTIVATED CLOTTING TIME
Activated Clotting Time: 296 seconds
Activated Clotting Time: 532 seconds

## 2019-02-16 LAB — PROTIME-INR
INR: 1 (ref 0.8–1.2)
Prothrombin Time: 13 seconds (ref 11.4–15.2)

## 2019-02-16 LAB — HEPARIN LEVEL (UNFRACTIONATED)
Heparin Unfractionated: 0.3 IU/mL (ref 0.30–0.70)
Heparin Unfractionated: 0.43 IU/mL (ref 0.30–0.70)

## 2019-02-16 LAB — NOVEL CORONAVIRUS, NAA (HOSP ORDER, SEND-OUT TO REF LAB; TAT 18-24 HRS): SARS-CoV-2, NAA: NOT DETECTED

## 2019-02-16 SURGERY — LEFT HEART CATH AND CORONARY ANGIOGRAPHY
Anesthesia: LOCAL

## 2019-02-16 MED ORDER — SODIUM CHLORIDE 0.9 % IV SOLN: 250.0000 mL | INTRAVENOUS | Status: DC | PRN

## 2019-02-16 MED ORDER — SODIUM CHLORIDE 0.9 % WEIGHT BASED INFUSION: 3.0000 mL/kg/h | INTRAVENOUS | Status: DC

## 2019-02-16 MED ORDER — FENTANYL CITRATE (PF) 100 MCG/2ML IJ SOLN
INTRAMUSCULAR | Status: DC | PRN
  Administered 2019-02-16: 50 ug via INTRAVENOUS

## 2019-02-16 MED ORDER — SODIUM CHLORIDE 0.9 % IV SOLN
INTRAVENOUS | Status: AC
  Administered 2019-02-16: 19:00:00 via INTRAVENOUS

## 2019-02-16 MED ORDER — VERAPAMIL HCL 2.5 MG/ML IV SOLN
INTRAVENOUS | Status: AC
  Filled 2019-02-16: qty 2

## 2019-02-16 MED ORDER — SODIUM CHLORIDE 0.9 % WEIGHT BASED INFUSION
1.0000 mL/kg/h | INTRAVENOUS | Status: DC
  Administered 2019-02-16: 1 mL/kg/h via INTRAVENOUS

## 2019-02-16 MED ORDER — MIDAZOLAM HCL 2 MG/2ML IJ SOLN
INTRAMUSCULAR | Status: AC
  Filled 2019-02-16: qty 2

## 2019-02-16 MED ORDER — FENTANYL CITRATE (PF) 100 MCG/2ML IJ SOLN
INTRAMUSCULAR | Status: AC
  Filled 2019-02-16: qty 2

## 2019-02-16 MED ORDER — HEPARIN (PORCINE) IN NACL 1000-0.9 UT/500ML-% IV SOLN
INTRAVENOUS | Status: AC
  Filled 2019-02-16: qty 1000

## 2019-02-16 MED ORDER — LIDOCAINE HCL (PF) 1 % IJ SOLN
INTRAMUSCULAR | Status: AC
  Filled 2019-02-16: qty 30

## 2019-02-16 MED ORDER — IOHEXOL 350 MG/ML SOLN
INTRAVENOUS | Status: DC | PRN
  Administered 2019-02-16: 140 mL via INTRA_ARTERIAL

## 2019-02-16 MED ORDER — NITROGLYCERIN 1 MG/10 ML FOR IR/CATH LAB
INTRA_ARTERIAL | Status: DC | PRN
  Administered 2019-02-16 (×2): 200 ug via INTRACORONARY
  Administered 2019-02-16: 400 ug via INTRA_ARTERIAL

## 2019-02-16 MED ORDER — MIDAZOLAM HCL 2 MG/2ML IJ SOLN
INTRAMUSCULAR | Status: DC | PRN
  Administered 2019-02-16: 2 mg via INTRAVENOUS

## 2019-02-16 MED ORDER — HEPARIN (PORCINE) IN NACL 1000-0.9 UT/500ML-% IV SOLN
INTRAVENOUS | Status: DC | PRN
  Administered 2019-02-16 (×2): 500 mL

## 2019-02-16 MED ORDER — TICAGRELOR 90 MG PO TABS
ORAL_TABLET | ORAL | Status: DC | PRN
  Administered 2019-02-16: 180 mg via ORAL

## 2019-02-16 MED ORDER — SODIUM CHLORIDE 0.9% FLUSH: 3.0000 mL | INTRAVENOUS | Status: DC | PRN

## 2019-02-16 MED ORDER — NITROGLYCERIN 1 MG/10 ML FOR IR/CATH LAB
INTRA_ARTERIAL | Status: AC
  Filled 2019-02-16: qty 10

## 2019-02-16 MED ORDER — LABETALOL HCL 5 MG/ML IV SOLN: 10.0000 mg | INTRAVENOUS | Status: AC | PRN

## 2019-02-16 MED ORDER — LIDOCAINE HCL (PF) 1 % IJ SOLN
INTRAMUSCULAR | Status: DC | PRN
  Administered 2019-02-16: 2 mL

## 2019-02-16 MED ORDER — SODIUM CHLORIDE 0.9% FLUSH
3.0000 mL | Freq: Two times a day (BID) | INTRAVENOUS | Status: DC
  Administered 2019-02-16: 3 mL via INTRAVENOUS

## 2019-02-16 MED ORDER — ASPIRIN 81 MG PO CHEW: 81.0000 mg | CHEWABLE_TABLET | Freq: Every day | ORAL | Status: DC

## 2019-02-16 MED ORDER — ASPIRIN 81 MG PO CHEW: 81.0000 mg | CHEWABLE_TABLET | ORAL | Status: DC

## 2019-02-16 MED ORDER — ONDANSETRON HCL 4 MG/2ML IJ SOLN: 4.0000 mg | Freq: Four times a day (QID) | INTRAMUSCULAR | Status: DC | PRN

## 2019-02-16 MED ORDER — ACETAMINOPHEN 325 MG PO TABS: 650.0000 mg | ORAL_TABLET | ORAL | Status: DC | PRN

## 2019-02-16 MED ORDER — TICAGRELOR 90 MG PO TABS
ORAL_TABLET | ORAL | Status: AC
  Filled 2019-02-16: qty 2

## 2019-02-16 MED ORDER — POTASSIUM CHLORIDE CRYS ER 20 MEQ PO TBCR
40.0000 meq | EXTENDED_RELEASE_TABLET | Freq: Once | ORAL | Status: AC
  Administered 2019-02-16: 40 meq via ORAL
  Filled 2019-02-16: qty 2

## 2019-02-16 MED ORDER — HEPARIN SODIUM (PORCINE) 1000 UNIT/ML IJ SOLN
INTRAMUSCULAR | Status: DC | PRN
  Administered 2019-02-16 (×2): 5000 [IU] via INTRAVENOUS
  Administered 2019-02-16: 5500 [IU] via INTRAVENOUS

## 2019-02-16 MED ORDER — HYDRALAZINE HCL 20 MG/ML IJ SOLN: 10.0000 mg | INTRAMUSCULAR | Status: AC | PRN

## 2019-02-16 MED ORDER — VERAPAMIL HCL 2.5 MG/ML IV SOLN
INTRAVENOUS | Status: DC | PRN
  Administered 2019-02-16: 10 mL via INTRA_ARTERIAL

## 2019-02-16 MED ORDER — TICAGRELOR 90 MG PO TABS
90.0000 mg | ORAL_TABLET | Freq: Two times a day (BID) | ORAL | Status: DC
  Administered 2019-02-17: 90 mg via ORAL
  Filled 2019-02-16: qty 1

## 2019-02-16 MED ORDER — HEPARIN (PORCINE) 25000 UT/250ML-% IV SOLN
1300.0000 [IU]/h | INTRAVENOUS | Status: DC
  Administered 2019-02-16: 1300 [IU]/h via INTRAVENOUS
  Filled 2019-02-16: qty 250

## 2019-02-16 MED ORDER — HEPARIN SODIUM (PORCINE) 1000 UNIT/ML IJ SOLN
INTRAMUSCULAR | Status: AC
  Filled 2019-02-16: qty 1

## 2019-02-16 SURGICAL SUPPLY — 19 items
BAG SNAP BAND KOVER 36X36 (MISCELLANEOUS) ×1 IMPLANT
BALLN SAPPHIRE 2.0X12 (BALLOONS) ×2
BALLN SAPPHIRE ~~LOC~~ 2.5X12 (BALLOONS) ×1 IMPLANT
BALLOON SAPPHIRE 2.0X12 (BALLOONS) IMPLANT
CATH 5FR JL3.5 JR4 ANG PIG MP (CATHETERS) ×1 IMPLANT
CATH LAUNCHER 6FR EBU3.5 (CATHETERS) ×1 IMPLANT
COVER DOME SNAP 22 D (MISCELLANEOUS) ×1 IMPLANT
DEVICE RAD COMP TR BAND LRG (VASCULAR PRODUCTS) ×1 IMPLANT
GLIDESHEATH SLEND SS 6F .021 (SHEATH) ×1 IMPLANT
GUIDEWIRE INQWIRE 1.5J.035X260 (WIRE) IMPLANT
INQWIRE 1.5J .035X260CM (WIRE) ×2
KIT ENCORE 26 ADVANTAGE (KITS) ×1 IMPLANT
KIT HEART LEFT (KITS) ×2 IMPLANT
KIT HEMO VALVE WATCHDOG (MISCELLANEOUS) ×1 IMPLANT
PACK CARDIAC CATHETERIZATION (CUSTOM PROCEDURE TRAY) ×2 IMPLANT
STENT RESOLUTE ONYX 2.25X15 (Permanent Stent) ×1 IMPLANT
TRANSDUCER W/STOPCOCK (MISCELLANEOUS) ×2 IMPLANT
TUBING CIL FLEX 10 FLL-RA (TUBING) ×2 IMPLANT
WIRE ASAHI PROWATER 180CM (WIRE) ×1 IMPLANT

## 2019-02-16 NOTE — Care Management Obs Status (Deleted)
[facility] NOTIFICATION   Patient Details  Name: RAYSHUN KANDLER MRN: [id number] Date of Birth: [birth year]   Medicare Observation Status Notification Given:       Wende Neighbors, LCSW 02/16/2019, 9:11 AM

## 2019-02-16 NOTE — Care Management Important Message (Deleted)
Important Message  Patient Details  Name: Bruce Spencer MRN: [id number] Date of Birth: 01/17/1951   Medicare Important Message Given:  Yes     Wende Neighbors, LCSW 02/16/2019, 9:10 AM

## 2019-02-16 NOTE — H&P (View-Only) (Signed)
Cardiology Consultation:   Patient ID: Bruce Spencer; [id number]; 02-28-1951   Admit date: 02/15/2019 Date of Consult: 02/16/2019  Primary Care Provider: Jarrett SohoWharton, Courtney, PA-C Primary Cardiologist: No primary care provider on file.  Primary Electrophysiologist:  None   Patient Profile:   Bruce LocksHorace W Stolar is a 68 y.o. male with a hx of HTN, pre-diabetes, no hx HLD, who is being seen today for the evaluation of chest pain at the request of Dr Humberto Leephompsom.  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  He left work on [employer] and developed chest pain when he laid down to sleep. 2/10, throbbing in upper middle chest. He was unable to sleep. Position change, deep inspiration, ambulation, no change. He tried Aleve, no help.   He thought he had strained muscles in his chest from doing more lifting than usual, did not tell his wife.  He laid down again as usual, at 9:30 pm, but could not sleep. Went to work at usual at 4 am on Thursday. After 4 hr, pain was increased, to 4/10. He told supervisor>>left work>>came home and told his wife. She told him they were going to the hospital.   In the ER, a GI cocktail helped a little, but nitro helped the most. He also got aspirin and heparin. After 3 nitro, the pain resolved and has not returned.   Has never had this before. No recent problems w/ SOB, excessive fatigue, DOE, weakness.   No presyncope, syncope. No palpitations.   Past Medical History:  Diagnosis Date  . Hypertension 2015  . Obesity     History reviewed. No pertinent surgical history.   Prior to Admission medications   Medication Sig Start Date End Date Taking? Authorizing Provider  amLODipine (NORVASC) 5 MG tablet Take 5 mg by mouth daily. 01/26/19  Yes [provider]  Multiple Vitamin (MULTIVITAMIN WITH MINERALS) TABS tablet Take 1 tablet by mouth daily.   Yes [provider]  OVER THE COUNTER MEDICATION Take 1 capsule by mouth  3 (three) times a week. Super Beta Prostate   Yes [provider]    Inpatient Medications: Scheduled Meds: . amLODipine  5 mg Oral Daily  . aspirin  324 mg Oral NOW   Or  . aspirin  300 mg Rectal NOW  . aspirin EC  81 mg Oral Daily  . atorvastatin  40 mg Oral q1800   Continuous Infusions: . heparin 1,300 Units/hr (02/16/19 0849)   PRN Meds: acetaminophen, hydrALAZINE, nitroGLYCERIN, ondansetron (ZOFRAN) IV, zolpidem  Allergies:   No Known Allergies  Social History:   Social History   Socioeconomic History  . Marital status: Married    Spouse name: Not on file  . Number of children: Not on file  . Years of education: Not on file  . Highest education level: Not on file  Occupational History  . Occupation: 4 am to noon    Employer: [employer]  Social Needs  . Financial resource strain: Not on file  . Food insecurity    Worry: Not on file    Inability: Not on file  . Transportation needs    Medical: Not on file    Non-medical: Not on file  Tobacco Use  . Smoking status: Never Smoker  . Smokeless tobacco: Never Used  Substance and Sexual Activity  . Alcohol use: Yes    Frequency: Never    Comment: socially  . Drug use: Never  .  Sexual activity: Not on file  Lifestyle  . Physical activity    Days per week: Not on file    Minutes per session: Not on file  . Stress: Not on file  Relationships  . Social Herbalist on phone: Not on file    Gets together: Not on file    Attends religious service: Not on file    Active member of club or organization: Not on file    Attends meetings of clubs or organizations: Not on file    Relationship status: Not on file  . Intimate partner violence    Fear of current or ex partner: Not on file    Emotionally abused: Not on file    Physically abused: Not on file    Forced sexual activity: Not on file  Other Topics Concern  . Not on file  Social History Narrative  . Not on file    Family  History:   Family History  Problem Relation Age of Onset  . Heart failure Mother        died at 70  . Aneurysm Father 19   Family Status:  Family Status  Relation Name Status  . Mother  Deceased  . Father  Deceased  . MGM  Deceased  . MGF  Deceased  . PGM  Deceased  . PGF  Deceased    ROS:  Please see the history of present illness.  All other ROS reviewed and negative.     Physical Exam/Data:   Vitals:   02/15/19 2100 02/15/19 2130 02/15/19 2149 02/16/19 0510  BP: (!) 165/88 (!) 154/88 (!) 159/85 117/66  Pulse: 62 66 68 64  Resp: 19 (!) 24 20 18   Temp:   97.7 F (36.5 C) 98.1 F (36.7 C)  TempSrc:   Oral Oral  SpO2: 97% 98% 99% 97%  Weight:    107 kg  Height:        Intake/Output Summary (Last 24 hours) at 02/16/2019 1031 Last data filed at 02/16/2019 0559 Gross per 24 hour  Intake 280.92 ml  Output -  Net 280.92 ml   Filed Weights   02/15/19 0958 02/16/19 0510  Weight: 100.7 kg 107 kg   Body mass index is 32.9 kg/m.  General:  Well nourished, well developed, in no acute distress HEENT: normal Lymph: no adenopathy Neck: no JVD Endocrine:  No thryomegaly Vascular: No carotid bruits; 4/4 extremity pulses 2+, without bruits  Cardiac:  normal S1, S2; RRR; no murmur  Lungs:  clear to auscultation bilaterally, no wheezing, rhonchi or rales  Abd: soft, nontender, no hepatomegaly  Ext: no edema Musculoskeletal:  No deformities, BUE and BLE strength normal and equal Skin: warm and dry  Neuro:  CNs 2-12 intact, no focal abnormalities noted Psych:  Normal affect   EKG:  The EKG was personally reviewed and demonstrates:  06/26, SR, HR 66, lateral T waves are a little different from 06/25 Telemetry:  Telemetry was personally reviewed and demonstrates:  SR  Relevant CV Studies:  None  Laboratory Data:  Chemistry Recent Labs  Lab 02/15/19 1100 02/15/19 1648 02/16/19 0205  NA 136  --  138  K SPECIMEN HEMOLYZED. HEMOLYSIS MAY AFFECT INTEGRITY OF  RESULTS.  --  3.5  CL 108  --  105  CO2 22  --  22  GLUCOSE 105*  --  125*  BUN 11  --  9  CREATININE 0.89 0.66 0.76  CALCIUM 8.6*  --  8.5*  GFRNONAA >60 >60 >60  GFRAA >60 >60 >60  ANIONGAP 6  --  11    No results found for: ALT, AST, GGT, ALKPHOS, BILITOT Hematology Recent Labs  Lab 02/15/19 0947 02/16/19 0205  WBC 12.6* 8.0  RBC 6.12* 5.82*  HGB 14.7 14.4  HCT 50.1 47.1  MCV 81.9 80.9  MCH 24.0* 24.7*  MCHC 29.3* 30.6  RDW 16.1* 15.9*  PLT 236 169   Cardiac Enzymes  Troponin I (High Sensitivity)  Date Value Ref Range Status  02/15/2019 773.0 (HH) <18 ng/L Final    Comment:    DELTA CHECK NOTED REPEATED TO VERIFY CRITICAL VALUE NOTED.  VALUE IS CONSISTENT WITH PREVIOUSLY REPORTED AND CALLED VALUE. (NOTE) Elevated high sensitivity troponin I (hsTnI) values and significant  changes across serial measurements may suggest ACS but many other  chronic and acute conditions are known to elevate hsTnI results.  Refer to the Links section for chest pain algorithms and additional  guidance. Performed at [facility], [address] [place], [place] [address]   02/15/2019 364.0 (HH) <18 ng/L Final    Comment:    DELTA CHECK NOTED QUANTITY NOT SUFFICIENT TO REPEAT TEST CRITICAL VALUE NOTED.  VALUE IS CONSISTENT WITH PREVIOUSLY REPORTED AND CALLED VALUE. (NOTE) Elevated high sensitivity troponin I (hsTnI) values and significant  changes across serial measurements may suggest ACS but many other  chronic and acute conditions are known to elevate hsTnI results.  Refer to the Links section for chest pain algorithms and additional  guidance. Performed at [facility], [address] [place], [place] [address]   02/15/2019 132 (HH) <18 ng/L Final    Comment:    CRITICAL RESULT CALLED TO, READ BACK BY AND VERIFIED WITH: SMITH,T. RN AT 1335 02/15/19 MULLINS,T (NOTE) Elevated high sensitivity troponin I (hsTnI) values and significant   changes across serial measurements may suggest ACS but many other  chronic and acute conditions are known to elevate hsTnI results.  Refer to the Links section for chest pain algorithms and additional  guidance. Performed at [facility], [address] [place], [place] [address]    TSH:  Lab Results  Component Value Date   TSH 1.050 02/15/2019   Lipids: Lab Results  Component Value Date   CHOL 144 02/16/2019   HDL 48 02/16/2019   LDLCALC 79 02/16/2019   TRIG 83 02/16/2019   CHOLHDL 3.0 02/16/2019   HgbA1c:No results found for: HGBA1C Magnesium:  Magnesium  Date Value Ref Range Status  02/15/2019 1.9 1.7 - 2.4 mg/dL Final    Comment:    Performed at [facility], [address] [place], [place] [address]     Radiology/Studies:  Dg Chest 2 View  Result Date: 02/15/2019 CLINICAL DATA:  New onset chest pain EXAM: CHEST - 2 VIEW COMPARISON:  09/07/2011 FINDINGS: Normal heart size and mediastinal contours. No acute infiltrate or edema. No effusion or pneumothorax. No acute osseous findings. IMPRESSION: No active cardiopulmonary disease. Electronically Signed   By: Marnee SpringJonathon  Watts M.D.   On: 02/15/2019 11:17    Assessment and Plan:   COVID NEGATIVE  1. Chest pain, ACS:  - CRF are age, gender, HTN, pre-DM - no hx exertional sx, never had CP before, started after eating, when he laid down - however, sx responded to nitro and trop is elevated, climbing - feel cardiac cath is best option, The risks and benefits of a cardiac catheterization including, but not limited to, death,  stroke, MI, kidney damage and bleeding were discussed with the patient who indicates understanding and agrees to proceed.   2. HTN:  - BP initially elevated on admission, came down w/ rx and now controlled  3. Pre-DM - ck A1c  Otherwise, per IM Active Problems:   Chest pain at rest   Essential hypertension   ACS (acute coronary syndrome) (HCC)   Chest  pain   For questions or updates, please contact [facility] Please consult [email or web address] for contact info under Cardiology/STEMI.   Signed, Theodore DemarkRhonda Barrett, PA-C  02/16/2019 10:31 AM  History and all data above reviewed.  Patient examined.  I agree with the findings as above.  The patient presents with worrisome symptoms for unstable angina and indeed has elevated hsTrop with significant delta.  He has minimal cardiovascular risk factors and no acute EKG changes.  Prior symptom onset Wed he had no prior cardiac symptoms and no past work up.   The patient exam reveals COR:RRR  ,  Lungs: Clear  ,  Abd: Positive bowel sounds, no rebound no guarding, Ext No edema  .  All available labs, radiology testing, previous records reviewed. Agree with documented assessment and plan.   NQWMI:  I will call this an MI.  Could be pericarditis but no clinical evidence of this.  He needs a cardiac cath.  The patient understands that risks included but are not limited to stroke (1 in 1000), death (1 in 1000), kidney failure [usually temporary] (1 in 500), bleeding (1 in 200), allergic reaction [possibly serious] (1 in 200).  The patient understands and agrees to proceed.     Fayrene FearingJames Yong Wahlquist  11:30 AM  02/16/2019

## 2019-02-16 NOTE — Progress Notes (Signed)
ANTICOAGULATION CONSULT NOTE -  Consult  Pharmacy Consult for IV heparin Indication: chest pain/ACS  No Known Allergies  Patient Measurements: Height: 5\' 11"  (180.3 cm) Weight: 222 lb (100.7 kg) IBW/kg (Calculated) : 75.3 Heparin Dosing Weight: 96.1  Vital Signs: Temp: 97.7 F (36.5 C) (06/25 2149) BP: 159/85 (06/25 2149) Pulse Rate: 68 (06/25 2149)  Labs: Recent Labs    02/15/19 0947 02/15/19 1100 02/15/19 1648 02/16/19 0205  HGB 14.7  --   --  14.4  HCT 50.1  --   --  47.1  PLT 236  --   --  169  HEPARINUNFRC  --   --   --  0.30  CREATININE  --  0.89 0.66 0.76    Estimated Creatinine Clearance: 111.3 mL/min (by C-G formula based on SCr of 0.76 mg/dL).   Medical History: Past Medical History:  Diagnosis Date  . Hypertension   . Obesity     Medications:  Scheduled:  . amLODipine  5 mg Oral Daily  . aspirin  324 mg Oral NOW   Or  . aspirin  300 mg Rectal NOW  . aspirin EC  81 mg Oral Daily  . atorvastatin  40 mg Oral q1800   Infusions:  . heparin      Assessment: 68 yo male with ACS to start IV heparin per pharmacy dosing. Baseline labs drawn. No anticoag meds being taken PTA Today, 6/26 0205 HL = 0.30 at low end of goal, no bleeding or infusion issues per RN.   Goal of Therapy:  Heparin level 0.3-0.7 units/ml Monitor platelets by anticoagulation protocol: Yes   Plan:  Increase heparin drip to 1300 units/hr (to ensure stay in therapeutic range as bolus wears off) Recheck HL in 6 hours Daily CBC and heparin level   Lawana Pai R 02/16/2019,2:42 AM

## 2019-02-16 NOTE — Progress Notes (Signed)
Went to attempt IV access and the patient was not in the room per RN patient transported and order was not removed

## 2019-02-16 NOTE — Progress Notes (Signed)
Report called to Skeet Simmer at [facility] lab.  Made aware of pre cath orders that have not been completed prior to PTAR pick up.  All questions answered.  Family aware.

## 2019-02-16 NOTE — Care Management Important Message (Signed)
Important Message  Patient Details  Name: Bruce Spencer MRN: [id number] Date of Birth: 1950/12/20   Medicare Important Message Given:  Yes     Wende Neighbors, LCSW 02/16/2019, 9:47 AM

## 2019-02-16 NOTE — Progress Notes (Signed)
PROGRESS NOTE    Bruce Spencer  NFA:[id number] DOB: [birth year] DOA: 02/15/2019 PCP: Jarrett SohoWharton, Courtney, PA-C    Brief Narrative:  Patient is a pleasant [age] year old gentleman history of hypertension, prediabetes who presented to the ED with a 2-day history of midsternal chest pain, nonradiating.  Patient initially felt he had some strained muscles in his chest from doing more lifting than usual at his job.  With ongoing continued chest pain with some associated nausea while working because patient to go home where he told his wife and wife advised him to present to the hospital.  Patient seen in the ED GI cocktail given helped a little bit however nitroglycerin helped the most with his substernal chest pain.  Patient given aspirin.  Cardiac enzymes were cycled which were elevated with an increased delta.  Patient subsequently placed on IV heparin.  Cardiology consulted.  Patient for cardiac catheterization this afternoon 02/16/2019.   Assessment & Plan:   Principal Problem:   ACS (acute coronary syndrome) (HCC) Active Problems:   Chest pain at rest   Essential hypertension   Chest pain  1 acute coronary syndrome/acute MI Patient presented with midsternal chest pain with history of hypertension, prediabetes.  Patient with no history of hyperlipidemia.  Patient admitted EKG obtained on admission with normal sinus rhythm.  Nitroglycerin relieved his chest pain.  Cardiac enzymes cycled were elevated went from 1 32 > 364 >>773.  Fasting lipid panel with LDL of 79.  Patient on aspirin, statin, Norvasc, IV heparin.  Due to patient's elevated cardiac enzymes and increased delta cardiology consulted.  Patient has been assessed by cardiology who are recommending cardiac catheterization.  Patient for cardiac catheterization this afternoon.  Per cardiology.  2.  Hypertension Continue home regimen Norvasc.  May need to change to Coreg however will defer to cardiology for now.  3.  Prediabetes CBG of  125 this morning.  Check a hemoglobin A1c.  Will need outpatient follow-up with PCP.   DVT prophylaxis: Heparin Code Status: Full Family Communication: Updated patient.  Updated wife via speaker phone and patient room. Disposition Plan: Likely home when cleared by cardiology.   Consultants:   Cardiology  Procedures:   Chest x-ray 02/15/2019  Antimicrobials:  None   Subjective: Patient sitting up in bed.  Denies any shortness of breath.  States chest pain improved after being given nitroglycerin.  Denies any ongoing active chest pain.  Objective: Vitals:   02/15/19 2130 02/15/19 2149 02/16/19 0510 02/16/19 1240  BP: (!) 154/88 (!) 159/85 117/66   Pulse: 66 68 64   Resp: (!) 24 20 18    Temp:  97.7 F (36.5 C) 98.1 F (36.7 C)   TempSrc:  Oral Oral   SpO2: 98% 99% 97%   Weight:   107 kg 102.7 kg  Height:        Intake/Output Summary (Last 24 hours) at 02/16/2019 1417 Last data filed at 02/16/2019 0559 Gross per 24 hour  Intake 280.92 ml  Output -  Net 280.92 ml   Filed Weights   02/15/19 0958 02/16/19 0510 02/16/19 1240  Weight: 100.7 kg 107 kg 102.7 kg    Examination:  General exam: Appears calm and comfortable  Respiratory system: Clear to auscultation. Respiratory effort normal. Cardiovascular system: S1 & S2 heard, RRR. No JVD, murmurs, rubs, gallops or clicks. No pedal edema. Gastrointestinal system: Abdomen is nondistended, soft and nontender. No organomegaly or masses felt. Normal bowel sounds heard. Central nervous system: Alert and oriented. No focal neurological  deficits. Extremities: Symmetric 5 x 5 power. Skin: No rashes, lesions or ulcers Psychiatry: Judgement and insight appear normal. Mood & affect appropriate.     Data Reviewed: I have personally reviewed following labs and imaging studies  CBC: Recent Labs  Lab 02/15/19 0947 02/16/19 0205  WBC 12.6* 8.0  HGB 14.7 14.4  HCT 50.1 47.1  MCV 81.9 80.9  PLT 236 093   Basic Metabolic  Panel: Recent Labs  Lab 02/15/19 1100 02/15/19 1648 02/16/19 0205  NA 136  --  138  K SPECIMEN HEMOLYZED. HEMOLYSIS MAY AFFECT INTEGRITY OF RESULTS.  --  3.5  CL 108  --  105  CO2 22  --  22  GLUCOSE 105*  --  125*  BUN 11  --  9  CREATININE 0.89 0.66 0.76  CALCIUM 8.6*  --  8.5*  MG  --  1.9  --    GFR: Estimated Creatinine Clearance: 112.4 mL/min (by C-G formula based on SCr of 0.76 mg/dL). Liver Function Tests: No results for input(s): AST, ALT, ALKPHOS, BILITOT, PROT, ALBUMIN in the last 168 hours. No results for input(s): LIPASE, AMYLASE in the last 168 hours. No results for input(s): AMMONIA in the last 168 hours. Coagulation Profile: Recent Labs  Lab 02/16/19 1320  INR 1.0   Cardiac Enzymes: No results for input(s): CKTOTAL, CKMB, CKMBINDEX, TROPONINI in the last 168 hours. BNP (last 3 results) No results for input(s): PROBNP in the last 8760 hours. HbA1C: No results for input(s): HGBA1C in the last 72 hours. CBG: No results for input(s): GLUCAP in the last 168 hours. Lipid Profile: Recent Labs    02/16/19 0205  CHOL 144  HDL 48  LDLCALC 79  TRIG 83  CHOLHDL 3.0   Thyroid Function Tests: Recent Labs    02/15/19 1844  TSH 1.050   Anemia Panel: No results for input(s): VITAMINB12, FOLATE, FERRITIN, TIBC, IRON, RETICCTPCT in the last 72 hours. Sepsis Labs: No results for input(s): PROCALCITON, LATICACIDVEN in the last 168 hours.  Recent Results (from the past 240 hour(s))  Novel Coronavirus,NAA,(SEND-OUT TO REF LAB - TAT 24-48 hrs); Hosp Order     Status: None   Collection Time: 02/15/19  2:05 PM   Specimen: Nasopharyngeal Swab; Respiratory  Result Value Ref Range Status   SARS-CoV-2, NAA NOT DETECTED NOT DETECTED Final    Comment: (NOTE) This test was developed and its performance characteristics determined by [employer]. This test has not been FDA cleared or approved. This test has been authorized by FDA under an Emergency Use  Authorization (EUA). This test is only authorized for the duration of time the declaration that circumstances exist justifying the authorization of the emergency use of in vitro diagnostic tests for detection of SARS-CoV-2 virus and/or diagnosis of COVID-19 infection under section 564(b)(1) of the Act, 21 U.S.C. 818EXH-3(Z)(1), unless the authorization is terminated or revoked sooner. When diagnostic testing is negative, the possibility of a false negative result should be considered in the context of a patient's recent exposures and the presence of clinical signs and symptoms consistent with COVID-19. An individual without symptoms of COVID-19 and who is not shedding SARS-CoV-2 virus would expect to have a negative (not detected) result in this assay. Performed  At: [facility] [place], [place] [id number] Rush Farmer MD OF:[id number]    [place]  Final    Comment: Performed at [facility] [address] [place], [place] [address]  Radiology Studies: Dg Chest 2 View  Result Date: 02/15/2019 CLINICAL DATA:  New onset chest pain EXAM: CHEST - 2 VIEW COMPARISON:  09/07/2011 FINDINGS: Normal heart size and mediastinal contours. No acute infiltrate or edema. No effusion or pneumothorax. No acute osseous findings. IMPRESSION: No active cardiopulmonary disease. Electronically Signed   By: Marnee SpringJonathon  Watts M.D.   On: 02/15/2019 11:17        Scheduled Meds: . amLODipine  5 mg Oral Daily  . aspirin  81 mg Oral Pre-Cath  . aspirin EC  81 mg Oral Daily  . atorvastatin  40 mg Oral q1800  . sodium chloride flush  3 mL Intravenous Q12H   Continuous Infusions: . sodium chloride    . sodium chloride 1 mL/kg/hr (02/16/19 1244)  . heparin 1,300 Units/hr (02/16/19 0849)     LOS: 1 day    Time spent: 35 minutes    Ramiro Harvestaniel Glenda Kunst, MD [facility]  If 7PM-7AM, please contact night-coverage  [email or web address] 02/16/2019, 2:17 PM

## 2019-02-16 NOTE — Consult Note (Addendum)
Cardiology Consultation:   Patient ID: Bruce LocksHorace W Flud; [id number]; 02-28-1951   Admit date: 02/15/2019 Date of Consult: 02/16/2019  Primary Care Provider: Jarrett SohoWharton, Courtney, PA-C Primary Cardiologist: No primary care provider on file.  Primary Electrophysiologist:  None   Patient Profile:   Bruce Spencer is a 68 y.o. male with a hx of HTN, pre-diabetes, no hx HLD, who is being seen today for the evaluation of chest pain at the request of Dr Humberto Leephompsom.  History of Present Illness:   Mr. Gretchen Shortarpley was in his USOH till 2 days ago.  He left work on [employer] and developed chest pain when he laid down to sleep. 2/10, throbbing in upper middle chest. He was unable to sleep. Position change, deep inspiration, ambulation, no change. He tried Aleve, no help.   He thought he had strained muscles in his chest from doing more lifting than usual, did not tell his wife.  He laid down again as usual, at 9:30 pm, but could not sleep. Went to work at usual at 4 am on Thursday. After 4 hr, pain was increased, to 4/10. He told supervisor>>left work>>came home and told his wife. She told him they were going to the hospital.   In the ER, a GI cocktail helped a little, but nitro helped the most. He also got aspirin and heparin. After 3 nitro, the pain resolved and has not returned.   Has never had this before. No recent problems w/ SOB, excessive fatigue, DOE, weakness.   No presyncope, syncope. No palpitations.   Past Medical History:  Diagnosis Date  . Hypertension 2015  . Obesity     History reviewed. No pertinent surgical history.   Prior to Admission medications   Medication Sig Start Date End Date Taking? Authorizing Provider  amLODipine (NORVASC) 5 MG tablet Take 5 mg by mouth daily. 01/26/19  Yes [provider]  Multiple Vitamin (MULTIVITAMIN WITH MINERALS) TABS tablet Take 1 tablet by mouth daily.   Yes [provider]  OVER THE COUNTER MEDICATION Take 1 capsule by mouth  3 (three) times a week. Super Beta Prostate   Yes [provider]    Inpatient Medications: Scheduled Meds: . amLODipine  5 mg Oral Daily  . aspirin  324 mg Oral NOW   Or  . aspirin  300 mg Rectal NOW  . aspirin EC  81 mg Oral Daily  . atorvastatin  40 mg Oral q1800   Continuous Infusions: . heparin 1,300 Units/hr (02/16/19 0849)   PRN Meds: acetaminophen, hydrALAZINE, nitroGLYCERIN, ondansetron (ZOFRAN) IV, zolpidem  Allergies:   No Known Allergies  Social History:   Social History   Socioeconomic History  . Marital status: Married    Spouse name: Not on file  . Number of children: Not on file  . Years of education: Not on file  . Highest education level: Not on file  Occupational History  . Occupation: 4 am to noon    Employer: [employer]  Social Needs  . Financial resource strain: Not on file  . Food insecurity    Worry: Not on file    Inability: Not on file  . Transportation needs    Medical: Not on file    Non-medical: Not on file  Tobacco Use  . Smoking status: Never Smoker  . Smokeless tobacco: Never Used  Substance and Sexual Activity  . Alcohol use: Yes    Frequency: Never    Comment: socially  . Drug use: Never  .  Sexual activity: Not on file  Lifestyle  . Physical activity    Days per week: Not on file    Minutes per session: Not on file  . Stress: Not on file  Relationships  . Social Herbalist on phone: Not on file    Gets together: Not on file    Attends religious service: Not on file    Active member of club or organization: Not on file    Attends meetings of clubs or organizations: Not on file    Relationship status: Not on file  . Intimate partner violence    Fear of current or ex partner: Not on file    Emotionally abused: Not on file    Physically abused: Not on file    Forced sexual activity: Not on file  Other Topics Concern  . Not on file  Social History Narrative  . Not on file    Family  History:   Family History  Problem Relation Age of Onset  . Heart failure Mother        died at 70  . Aneurysm Father 19   Family Status:  Family Status  Relation Name Status  . Mother  Deceased  . Father  Deceased  . MGM  Deceased  . MGF  Deceased  . PGM  Deceased  . PGF  Deceased    ROS:  Please see the history of present illness.  All other ROS reviewed and negative.     Physical Exam/Data:   Vitals:   02/15/19 2100 02/15/19 2130 02/15/19 2149 02/16/19 0510  BP: (!) 165/88 (!) 154/88 (!) 159/85 117/66  Pulse: 62 66 68 64  Resp: 19 (!) 24 20 18   Temp:   97.7 F (36.5 C) 98.1 F (36.7 C)  TempSrc:   Oral Oral  SpO2: 97% 98% 99% 97%  Weight:    107 kg  Height:        Intake/Output Summary (Last 24 hours) at 02/16/2019 1031 Last data filed at 02/16/2019 0559 Gross per 24 hour  Intake 280.92 ml  Output -  Net 280.92 ml   Filed Weights   02/15/19 0958 02/16/19 0510  Weight: 100.7 kg 107 kg   Body mass index is 32.9 kg/m.  General:  Well nourished, well developed, in no acute distress HEENT: normal Lymph: no adenopathy Neck: no JVD Endocrine:  No thryomegaly Vascular: No carotid bruits; 4/4 extremity pulses 2+, without bruits  Cardiac:  normal S1, S2; RRR; no murmur  Lungs:  clear to auscultation bilaterally, no wheezing, rhonchi or rales  Abd: soft, nontender, no hepatomegaly  Ext: no edema Musculoskeletal:  No deformities, BUE and BLE strength normal and equal Skin: warm and dry  Neuro:  CNs 2-12 intact, no focal abnormalities noted Psych:  Normal affect   EKG:  The EKG was personally reviewed and demonstrates:  06/26, SR, HR 66, lateral T waves are a little different from 06/25 Telemetry:  Telemetry was personally reviewed and demonstrates:  SR  Relevant CV Studies:  None  Laboratory Data:  Chemistry Recent Labs  Lab 02/15/19 1100 02/15/19 1648 02/16/19 0205  NA 136  --  138  K SPECIMEN HEMOLYZED. HEMOLYSIS MAY AFFECT INTEGRITY OF  RESULTS.  --  3.5  CL 108  --  105  CO2 22  --  22  GLUCOSE 105*  --  125*  BUN 11  --  9  CREATININE 0.89 0.66 0.76  CALCIUM 8.6*  --  8.5*  GFRNONAA >60 >60 >60  GFRAA >60 >60 >60  ANIONGAP 6  --  11    No results found for: ALT, AST, GGT, ALKPHOS, BILITOT Hematology Recent Labs  Lab 02/15/19 0947 02/16/19 0205  WBC 12.6* 8.0  RBC 6.12* 5.82*  HGB 14.7 14.4  HCT 50.1 47.1  MCV 81.9 80.9  MCH 24.0* 24.7*  MCHC 29.3* 30.6  RDW 16.1* 15.9*  PLT 236 169   Cardiac Enzymes  Troponin I (High Sensitivity)  Date Value Ref Range Status  02/15/2019 773.0 (HH) <18 ng/L Final    Comment:    DELTA CHECK NOTED REPEATED TO VERIFY CRITICAL VALUE NOTED.  VALUE IS CONSISTENT WITH PREVIOUSLY REPORTED AND CALLED VALUE. (NOTE) Elevated high sensitivity troponin I (hsTnI) values and significant  changes across serial measurements may suggest ACS but many other  chronic and acute conditions are known to elevate hsTnI results.  Refer to the Links section for chest pain algorithms and additional  guidance. Performed at [facility], [address] [place], [place] [address]   02/15/2019 364.0 (HH) <18 ng/L Final    Comment:    DELTA CHECK NOTED QUANTITY NOT SUFFICIENT TO REPEAT TEST CRITICAL VALUE NOTED.  VALUE IS CONSISTENT WITH PREVIOUSLY REPORTED AND CALLED VALUE. (NOTE) Elevated high sensitivity troponin I (hsTnI) values and significant  changes across serial measurements may suggest ACS but many other  chronic and acute conditions are known to elevate hsTnI results.  Refer to the Links section for chest pain algorithms and additional  guidance. Performed at [facility], [address] [place], [place] [address]   02/15/2019 132 (HH) <18 ng/L Final    Comment:    CRITICAL RESULT CALLED TO, READ BACK BY AND VERIFIED WITH: SMITH,T. RN AT 1335 02/15/19 MULLINS,T (NOTE) Elevated high sensitivity troponin I (hsTnI) values and significant   changes across serial measurements may suggest ACS but many other  chronic and acute conditions are known to elevate hsTnI results.  Refer to the Links section for chest pain algorithms and additional  guidance. Performed at [facility], [address] [place], [place] [address]    TSH:  Lab Results  Component Value Date   TSH 1.050 02/15/2019   Lipids: Lab Results  Component Value Date   CHOL 144 02/16/2019   HDL 48 02/16/2019   LDLCALC 79 02/16/2019   TRIG 83 02/16/2019   CHOLHDL 3.0 02/16/2019   HgbA1c:No results found for: HGBA1C Magnesium:  Magnesium  Date Value Ref Range Status  02/15/2019 1.9 1.7 - 2.4 mg/dL Final    Comment:    Performed at [facility], [address] [place], [place] [address]     Radiology/Studies:  Dg Chest 2 View  Result Date: 02/15/2019 CLINICAL DATA:  New onset chest pain EXAM: CHEST - 2 VIEW COMPARISON:  09/07/2011 FINDINGS: Normal heart size and mediastinal contours. No acute infiltrate or edema. No effusion or pneumothorax. No acute osseous findings. IMPRESSION: No active cardiopulmonary disease. Electronically Signed   By: Jonathon  Watts M.D.   On: 02/15/2019 11:17    Assessment and Plan:   COVID NEGATIVE  1. Chest pain, ACS:  - CRF are age, gender, HTN, pre-DM - no hx exertional sx, never had CP before, started after eating, when he laid down - however, sx responded to nitro and trop is elevated, climbing - feel cardiac cath is best option, The risks and benefits of a cardiac catheterization including, but not limited to, death,   stroke, MI, kidney damage and bleeding were discussed with the patient who indicates understanding and agrees to proceed.   2. HTN:  - BP initially elevated on admission, came down w/ rx and now controlled  3. Pre-DM - ck A1c  Otherwise, per IM Active Problems:   Chest pain at rest   Essential hypertension   ACS (acute coronary syndrome) (HCC)   Chest  pain   For questions or updates, please contact [facility] Please consult [email or web address] for contact info under Cardiology/STEMI.   Signed, Theodore DemarkRhonda Barrett, PA-C  02/16/2019 10:31 AM  History and all data above reviewed.  Patient examined.  I agree with the findings as above.  The patient presents with worrisome symptoms for unstable angina and indeed has elevated hsTrop with significant delta.  He has minimal cardiovascular risk factors and no acute EKG changes.  Prior symptom onset Wed he had no prior cardiac symptoms and no past work up.   The patient exam reveals COR:RRR  ,  Lungs: Clear  ,  Abd: Positive bowel sounds, no rebound no guarding, Ext No edema  .  All available labs, radiology testing, previous records reviewed. Agree with documented assessment and plan.   NQWMI:  I will call this an MI.  Could be pericarditis but no clinical evidence of this.  He needs a cardiac cath.  The patient understands that risks included but are not limited to stroke (1 in 1000), death (1 in 1000), kidney failure [usually temporary] (1 in 500), bleeding (1 in 200), allergic reaction [possibly serious] (1 in 200).  The patient understands and agrees to proceed.     Fayrene FearingJames Joran Kallal  11:30 AM  02/16/2019

## 2019-02-16 NOTE — Care Management Obs Status (Signed)
[facility] NOTIFICATION   Patient Details  Name: CORDARYL DECELLES MRN: [id number] Date of Birth: June 25, 1951   Medicare Observation Status Notification Given:  Yes    Wende Neighbors, LCSW 02/16/2019, 9:48 AM

## 2019-02-16 NOTE — Progress Notes (Addendum)
[facility] for IV heparin Indication: chest pain/ACS  No Known Allergies  Patient Measurements: Height: 5\' 11"  (180.3 cm) Weight: 235 lb 14.3 oz (107 kg) IBW/kg (Calculated) : 75.3 Heparin Dosing Weight: 96.1  Vital Signs: Temp: 98.1 F (36.7 C) (06/26 0510) Temp Source: Oral (06/26 0510) BP: 117/66 (06/26 0510) Pulse Rate: 64 (06/26 0510)  Labs: Recent Labs    02/15/19 0947 02/15/19 1100 02/15/19 1648 02/16/19 0205 02/16/19 0947  HGB 14.7  --   --  14.4  --   HCT 50.1  --   --  47.1  --   PLT 236  --   --  169  --   HEPARINUNFRC  --   --   --  0.30 0.43  CREATININE  --  0.89 0.66 0.76  --     Estimated Creatinine Clearance: 114.6 mL/min (by C-G formula based on SCr of 0.76 mg/dL).   Medical History: Past Medical History:  Diagnosis Date  . Hypertension 2015  . Obesity     Medications:  Scheduled:  . amLODipine  5 mg Oral Daily  . aspirin  324 mg Oral NOW   Or  . aspirin  300 mg Rectal NOW  . aspirin EC  81 mg Oral Daily  . atorvastatin  40 mg Oral q1800   Infusions:  . heparin 1,300 Units/hr (02/16/19 0849)    Assessment: 68 yo male with ACS to start IV heparin per pharmacy dosing. Cards to see this AM   Baseline INR, aPTT: not done  Prior anticoagulation: none  Significant events:  Today, 02/16/2019:  CBC: stable WNL  Troponins elevated and trending up as of last night, not repeated today  Most recent heparin level therapeutic on 1300 units/hr  No bleeding or infusion issues per nursing  Patient reporting no symptoms currently (not requiring NTG either)  Goal of Therapy: Heparin level 0.3-0.7 units/ml Monitor platelets by anticoagulation protocol: Yes  Plan:  Continue heparin IV infusion at 1300 units/hr  Daily CBC and daily   Monitor for signs of bleeding or thrombosis  F/u cardiology plans   Reuel Boom, PharmD, BCPS [phone] 02/16/2019, 10:43 AM

## 2019-02-16 NOTE — Care Management CC44 (Signed)
Condition Code 44 Documentation Completed  Patient Details  Name: Bruce Spencer MRN: [id number] Date of Birth: 1951/04/22   Condition Code 44 given:  Yes Patient signature on Condition Code 44 notice:  Yes Documentation of 2 MD's agreement:  Yes Code 44 added to claim:  Yes    Wende Neighbors, LCSW 02/16/2019, 9:49 AM

## 2019-02-16 NOTE — Interval H&P Note (Signed)
Cath Lab Visit (complete for each Cath Lab visit)  Clinical Evaluation Leading to the Procedure:   ACS: Yes.    Non-ACS:    Anginal Classification: CCS IV  Anti-ischemic medical therapy: Minimal Therapy (1 class of medications)  Non-Invasive Test Results: No non-invasive testing performed  Prior CABG: No previous CABG      History and Physical Interval Note:  02/16/2019 4:05 PM  Bruce Spencer  has presented today for surgery, with the diagnosis of nonstemi.  The various methods of treatment have been discussed with the patient and family. After consideration of risks, benefits and other options for treatment, the patient has consented to  Procedure(s): LEFT HEART CATH AND CORONARY ANGIOGRAPHY (N/A) as a surgical intervention.  The patient's history has been reviewed, patient examined, no change in status, stable for surgery.  I have reviewed the patient's chart and labs.  Questions were answered to the patient's satisfaction.     Larae Grooms

## 2019-02-17 DIAGNOSIS — I214 Non-ST elevation (NSTEMI) myocardial infarction: Secondary | ICD-10-CM | POA: Diagnosis not present

## 2019-02-17 DIAGNOSIS — E785 Hyperlipidemia, unspecified: Secondary | ICD-10-CM | POA: Diagnosis not present

## 2019-02-17 DIAGNOSIS — E669 Obesity, unspecified: Secondary | ICD-10-CM | POA: Diagnosis not present

## 2019-02-17 DIAGNOSIS — Z7902 Long term (current) use of antithrombotics/antiplatelets: Secondary | ICD-10-CM | POA: Diagnosis not present

## 2019-02-17 DIAGNOSIS — Z1159 Encounter for screening for other viral diseases: Secondary | ICD-10-CM | POA: Diagnosis not present

## 2019-02-17 DIAGNOSIS — R7303 Prediabetes: Secondary | ICD-10-CM | POA: Diagnosis not present

## 2019-02-17 DIAGNOSIS — I10 Essential (primary) hypertension: Secondary | ICD-10-CM

## 2019-02-17 DIAGNOSIS — Z7982 Long term (current) use of aspirin: Secondary | ICD-10-CM | POA: Diagnosis not present

## 2019-02-17 DIAGNOSIS — I249 Acute ischemic heart disease, unspecified: Secondary | ICD-10-CM | POA: Diagnosis not present

## 2019-02-17 DIAGNOSIS — Z79899 Other long term (current) drug therapy: Secondary | ICD-10-CM | POA: Diagnosis not present

## 2019-02-17 LAB — CBC
HCT: 45.3 % (ref 39.0–52.0)
Hemoglobin: 13.8 g/dL (ref 13.0–17.0)
MCH: 24.4 pg — ABNORMAL LOW (ref 26.0–34.0)
MCHC: 30.5 g/dL (ref 30.0–36.0)
MCV: 80 fL (ref 80.0–100.0)
Platelets: 201 10*3/uL (ref 150–400)
RBC: 5.66 MIL/uL (ref 4.22–5.81)
RDW: 16.3 % — ABNORMAL HIGH (ref 11.5–15.5)
WBC: 7 10*3/uL (ref 4.0–10.5)
nRBC: 0 % (ref 0.0–0.2)

## 2019-02-17 LAB — HEMOGLOBIN A1C
Hgb A1c MFr Bld: 5.8 % — ABNORMAL HIGH (ref 4.8–5.6)
Hgb A1c MFr Bld: 5.9 % — ABNORMAL HIGH (ref 4.8–5.6)
Mean Plasma Glucose: 120 mg/dL
Mean Plasma Glucose: 122.63 mg/dL

## 2019-02-17 LAB — COMPREHENSIVE METABOLIC PANEL
ALT: 28 U/L (ref 0–44)
AST: 52 U/L — ABNORMAL HIGH (ref 15–41)
Albumin: 3.1 g/dL — ABNORMAL LOW (ref 3.5–5.0)
Alkaline Phosphatase: 63 U/L (ref 38–126)
Anion gap: 11 (ref 5–15)
BUN: 8 mg/dL (ref 8–23)
CO2: 23 mmol/L (ref 22–32)
Calcium: 8.5 mg/dL — ABNORMAL LOW (ref 8.9–10.3)
Chloride: 104 mmol/L (ref 98–111)
Creatinine, Ser: 0.9 mg/dL (ref 0.61–1.24)
GFR calc Af Amer: >60 mL/min (ref >60)
GFR calc non Af Amer: >60 mL/min (ref >60)
Glucose, Bld: 103 mg/dL — ABNORMAL HIGH (ref 70–99)
Potassium: 4.3 mmol/L (ref 3.5–5.1)
Sodium: 138 mmol/L (ref 135–145)
Total Bilirubin: 0.7 mg/dL (ref 0.3–1.2)
Total Protein: 6 g/dL — ABNORMAL LOW (ref 6.5–8.1)

## 2019-02-17 LAB — MAGNESIUM: Magnesium: 2 mg/dL (ref 1.7–2.4)

## 2019-02-17 MED ORDER — METOPROLOL TARTRATE 25 MG PO TABS: 25.0000 mg | ORAL_TABLET | Freq: Two times a day (BID) | ORAL | 3 refills | Status: DC

## 2019-02-17 MED ORDER — TICAGRELOR 90 MG PO TABS: 90.0000 mg | ORAL_TABLET | Freq: Two times a day (BID) | ORAL | 11 refills | Status: DC

## 2019-02-17 MED ORDER — ASPIRIN 81 MG PO TBEC: 81.0000 mg | DELAYED_RELEASE_TABLET | Freq: Every day | ORAL | Status: AC

## 2019-02-17 MED ORDER — NITROGLYCERIN 0.4 MG SL SUBL: 0.4000 mg | SUBLINGUAL_TABLET | SUBLINGUAL | 1 refills | Status: DC | PRN

## 2019-02-17 MED ORDER — ATORVASTATIN CALCIUM 40 MG PO TABS: 40.0000 mg | ORAL_TABLET | Freq: Every day | ORAL | 2 refills | Status: DC

## 2019-02-17 NOTE — Progress Notes (Signed)
CARDIAC REHAB PHASE I   PRE:  Rate/Rhythm: NSR 68  BP:  Sitting: 142/91     SaO2: 97% RA  MODE:  Ambulation: 770 ft   POST:  Rate/Rhythm: NSR 76  BP:  Sitting: 163/83     SaO2: 100% RA  [id number]  Pt ambulated 733ft with steady gait. No complaints.  To EOB after walk.  Education completed including stent, medications, diet, restrictions, and exercise guidelines.  Handouts given.  All questions answered.  Discussed virtual cardiac rehab and face to face.  Interest in virtual program. Pt referred to [facility] for CR Phase 2.   Pt is interested in participating in Virtual Cardiac Rehab. Pt advised that Virtual Cardiac Rehab is provided at no cost to the patient.  Checklist:  1. Pt has smart device  ie smartphone and/or ipad for downloading an app  Yes 2. Reliable internet/wifi service    Yes 3. Understands how to use their smartphone and navigate within an app.  Yes   Reviewed with pt the scheduling process for virtual cardiac rehab.  Pt verbalized understanding.  Noel Christmas, RN 02/17/2019 9:40 AM

## 2019-02-17 NOTE — Progress Notes (Signed)
Brilinta card given. 

## 2019-02-17 NOTE — Discharge Instructions (Signed)
Post NSTEMI: °NO HEAVY LIFTING X 2 WEEKS. °NO SEXUAL ACTIVITY X 2 WEEKS. °NO DRIVING X 1 WEEK. °NO SOAKING BATHS, HOT TUBS, POOLS, ETC., X 7 DAYS. ° °Radial Site Care: °Refer to this sheet in the next few weeks. These instructions provide you with information on caring for yourself after your procedure. Your caregiver may also give you more specific instructions. Your treatment has been planned according to current medical practices, but problems sometimes occur. Call your caregiver if you have any problems or questions after your procedure. °HOME CARE INSTRUCTIONS °· You may shower the day after the procedure. Remove the bandage (dressing) and gently wash the site with plain soap and water. Gently pat the site dry.  °· Do not apply powder or lotion to the site.  °· Do not submerge the affected site in water for 3 to 5 days.  °· Inspect the site at least twice daily.  °· Do not flex or bend the affected arm for 24 hours.  °· No lifting over 5 pounds (2.3 kg) for 5 days after your procedure.  °· Do not drive home if you are discharged the same day of the procedure. Have someone else drive you.  °What to expect: °· Any bruising will usually fade within 1 to 2 weeks.  °· Blood that collects in the tissue (hematoma) may be painful to the touch. It should usually decrease in size and tenderness within 1 to 2 weeks.  °SEEK IMMEDIATE MEDICAL CARE IF: °· You have unusual pain at the radial site.  °· You have redness, warmth, swelling, or pain at the radial site.  °· You have drainage (other than a small amount of blood on the dressing).  °· You have chills.  °· You have a fever or persistent symptoms for more than 72 hours.  °· You have a fever and your symptoms suddenly get worse.  °· Your arm becomes pale, cool, tingly, or numb.  °· You have heavy bleeding from the site. Hold pressure on the site.  ° ° °

## 2019-02-17 NOTE — Progress Notes (Signed)
Progress Note  Patient Name: Bruce Spencer Date of Encounter: 02/17/2019  Primary Cardiologist: Dr. Antoine PocheHochrein ([place])  Subjective   He is doing well this morning and is without complaints.  Inpatient Medications    Scheduled Meds: . amLODipine  5 mg Oral Daily  . aspirin EC  81 mg Oral Daily  . atorvastatin  40 mg Oral q1800  . sodium chloride flush  3 mL Intravenous Q12H  . ticagrelor  90 mg Oral BID   Continuous Infusions: . sodium chloride     PRN Meds: sodium chloride, acetaminophen, hydrALAZINE, nitroGLYCERIN, ondansetron (ZOFRAN) IV, sodium chloride flush, zolpidem   Vital Signs    Vitals:   02/16/19 1653 02/16/19 2008 02/17/19 0055 02/17/19 0501  BP: 121/76 (!) 150/72 126/72 (!) 142/80  Pulse: 77 73 66 70  Resp: 19 (!) 23  19  Temp:  98.3 F (36.8 C) 98 F (36.7 C) 97.8 F (36.6 C)  TempSrc:  Oral Oral Oral  SpO2: 99% 97% 99% 99%  Weight:    104.8 kg  Height:        Intake/Output Summary (Last 24 hours) at 02/17/2019 0954 Last data filed at 02/17/2019 0600 Gross per 24 hour  Intake 1287.54 ml  Output -  Net 1287.54 ml   Filed Weights   02/16/19 0510 02/16/19 1240 02/17/19 0501  Weight: 107 kg 102.7 kg 104.8 kg    Telemetry    Sinus rhythm- Personally Reviewed  ECG    Sinus rhythm- Personally Reviewed  Physical Exam   GEN: No acute distress.   Neck: No JVD Cardiac: RRR, no murmurs, rubs, or gallops.  Respiratory: Clear to auscultation bilaterally. GI: Soft, nontender, non-distended  MS: No edema; No deformity. Neuro:  Nonfocal  Psych: Normal affect   Labs    Chemistry Recent Labs  Lab 02/15/19 1100 02/15/19 1648 02/16/19 0205 02/17/19 0652  NA 136  --  138 138  K SPECIMEN HEMOLYZED. HEMOLYSIS MAY AFFECT INTEGRITY OF RESULTS.  --  3.5 4.3  CL 108  --  105 104  CO2 22  --  22 23  GLUCOSE 105*  --  125* 103*  BUN 11  --  9 8  CREATININE 0.89 0.66 0.76 0.90  CALCIUM 8.6*  --  8.5* 8.5*  PROT  --   --   --  6.0*  ALBUMIN   --   --   --  3.1*  AST  --   --   --  52*  ALT  --   --   --  28  ALKPHOS  --   --   --  63  BILITOT  --   --   --  0.7  GFRNONAA >60 >60 >60 >60  GFRAA >60 >60 >60 >60  ANIONGAP 6  --  11 11     Hematology Recent Labs  Lab 02/15/19 0947 02/16/19 0205 02/17/19 0652  WBC 12.6* 8.0 7.0  RBC 6.12* 5.82* 5.66  HGB 14.7 14.4 13.8  HCT 50.1 47.1 45.3  MCV 81.9 80.9 80.0  MCH 24.0* 24.7* 24.4*  MCHC 29.3* 30.6 30.5  RDW 16.1* 15.9* 16.3*  PLT 236 169 201    Cardiac EnzymesNo results for input(s): TROPONINI in the last 168 hours. No results for input(s): TROPIPOC in the last 168 hours.   BNPNo results for input(s): BNP, PROBNP in the last 168 hours.   DDimer No results for input(s): DDIMER in the last 168 hours.   Radiology  Dg Chest 2 View  Result Date: 02/15/2019 CLINICAL DATA:  New onset chest pain EXAM: CHEST - 2 VIEW COMPARISON:  09/07/2011 FINDINGS: Normal heart size and mediastinal contours. No acute infiltrate or edema. No effusion or pneumothorax. No acute osseous findings. IMPRESSION: No active cardiopulmonary disease. Electronically Signed   By: Monte Fantasia M.D.   On: 02/15/2019 11:17    Cardiac Studies   Cardiac catheterization 02/16/2019:   Dist RCA lesion is 25% stenosed.  Mid Cx lesion is 100% stenosed. A drug-eluting stent was successfully placed using a [place] 2.25X15.  Post intervention, there is a 0% residual stenosis.  Mid LAD lesion is 25% stenosed.  The left ventricular systolic function is normal.  LV end diastolic pressure is normal.  The left ventricular ejection fraction is 55-65% by visual estimate.  There is no aortic valve stenosis.  A drug-eluting stent was successfully placed using a [place] 2.25X15.   Continue dual antiplatelet therapy for at least one year.  Continue aggressive secondary prevention.   Patient Profile     68 y.o. male with a hx of HTN, pre-diabetes, no hx HLD, who is being seen  today for the evaluation of chest pain at the request of Dr Olivia Mackie.  He was found to have coronary disease and underwent stent placement on 02/16/2019.  Assessment & Plan    1.  Non-STEMI: High-sensitivity troponins peaked at 773.  Status post drug-eluting stent placement to the mid left circumflex.  LVEF is normal.  Symptomatically stable.  Continue dual antiplatelet therapy with aspirin and Brilinta for at least 1 year.  Continue atorvastatin 40 mg.  He will need cardiac rehabilitation.  2.  Hypertension: Blood pressure is mildly elevated today.  No adjustments to therapy but this will need further monitoring in the outpatient setting.  3.  Prediabetes mellitus: We will need risk factor modification.  HbA1c 5.9%.  4.  Hyperlipidemia: LDL 79.  Goal is less than 70.  Currently on atorvastatin 40 mg.    Disposition: We will discharge to home today.  For questions or updates, please contact [facility] Please consult [email or web address] for contact info under Cardiology/STEMI.      Signed, Kate Sable, MD  02/17/2019, 9:54 AM

## 2019-02-17 NOTE — Discharge Summary (Signed)
Discharge Summary    Patient ID: Bruce Spencer Kanitz MRN: [id number]; DOB: 1951/05/21  Admit date: 02/15/2019 Discharge date: 02/17/2019  Primary Care Provider: Jarrett SohoWharton, Courtney, PA-C  Primary Cardiologist: Rollene RotundaJames Hochrein, MD  Primary Electrophysiologist:  None   Discharge Diagnoses    Principal Problem:   Non-ST elevation (NSTEMI) myocardial infarction [facility](HCC) Active Problems:   Essential hypertension   Pre-diabetes   Allergies No Known Allergies  Diagnostic Studies/Procedures    Left Heart Catheterization 02/16/2019:  Dist RCA lesion is 25% stenosed.  Mid Cx lesion is 100% stenosed. A drug-eluting stent was successfully placed using a STENT RESOLUTE ONYX 2.25X15.  Post intervention, there is a 0% residual stenosis.  Mid LAD lesion is 25% stenosed.  The left ventricular systolic function is normal.  LV end diastolic pressure is normal.  The left ventricular ejection fraction is 55-65% by visual estimate.  There is no aortic valve stenosis.  A drug-eluting stent was successfully placed using a STENT RESOLUTE ONYX 2.25X15.   Continue dual antiplatelet therapy for at least one year.  Continue aggressive secondary prevention.  _____________   History of Present Illness     Mr. Bruce Spencer Worden is a 68 y.o. male with a history of hypertension and pre-diabetes who presented to the ED on 02/15/2019 for chest pain. Patient reports being in his usual state of health until a couple of days ago when he developed chest pain. He left work on Wednesday 02/14/2019 and developed chest pain when he laid down to sleep. He ranked it as a 2/10 and described it as throbbing in upper middle chest. He was unable to sleep. No changes with position change, deep inspiration, or ambulation. He tried taking Aleve with no relief. He thought he had strained muscles in his chest from doing more lifting than usual so he did not tell his wife.  He laid down again as usual, at 9:30 pm, but could not  sleep. Went to work as usual at 4 am on Thursday 02/15/2019. After 4 hours, pain was increased to a 4/10 on the pain scale. He told his  Supervisor, left work, and then went home and told his wife. She told him they were going to the hospital.   In the ED, a GI cocktail helped a little but Nitro helped the most. High sensitivity troponin at 132. He got Aspirin and Heparin. After 3 doses of Nitro, the pain resolved and had not returned at the time of initial evaluation by Cardiology.  Has never had this before. No recent problems with shortness of breath, excessive fatigue, dyspnea on exertion, or weakness. No presyncope, syncope. No palpitations.    Hospital Course     Consultants: None  NSTEMI Patient was admitted with NSTEMI on 02/15/2019. High-sensitivity troponin peaked at 773. Patient underwent cardiac catheterization on 02/16/2019 which showed 100 stenosis of mid CX with mild non-obstructive disease elsewhere. LVEF and LVEDP normal. Patient underwent successful PCI with DES to the mid CX lesion. Patient doing well this morning. Renal function stable. Able to ambulate with Cardiac Rehab without any difficulties.  - Continue dual antiplatelet therapy with Aspirin and Brilinta.  - Continue aggressive secondary preventions. LDL 79 this admission. Goal <70 given CAD. Lipitor 40mg  daily added this admission. AST mildly elevated at 52 so will need to monitor LFTs closely. Will Will plan to repeat lipid panel and CMP in 6 weeks. Will also add Lopressor 25mg  twice daily at discharge.  Hypertension - Most recent BP 142/80. - Continue home Amlodipine  5mg  daily. - Will add Lopressor 25mg  twice daily. - Given pre-diabetes, considering replacing Amlodipine with ACEi/ARB for renal protection as outpatient.  Pre-Diabetes - Hemoglobin A1c 5.9, consistent with pre-diabetes. - Follow-up with PCP.  Patient seen and examined by Dr. Purvis SheffieldKoneswaran today and determined to be stable for discharge. Will arrange  for outpatient follow-up. Medications as below. _____________  Discharge Vitals Blood pressure (!) 142/80, pulse 70, temperature 97.8 F (36.6 C), temperature source Oral, resp. rate 19, height 5\' 11"  (1.803 m), weight 104.8 kg, SpO2 99 %.  Filed Weights   02/16/19 0510 02/16/19 1240 02/17/19 0501  Weight: 107 kg 102.7 kg 104.8 kg    Labs & Radiologic Studies    CBC Recent Labs    02/16/19 0205 02/17/19 0652  WBC 8.0 7.0  HGB 14.4 13.8  HCT 47.1 45.3  MCV 80.9 80.0  PLT 169 201   Basic Metabolic Panel Recent Labs    16/05/9605/25/20 1648 02/16/19 0205 02/17/19 0652  NA  --  138 138  K  --  3.5 4.3  CL  --  105 104  CO2  --  22 23  GLUCOSE  --  125* 103*  BUN  --  9 8  CREATININE 0.66 0.76 0.90  CALCIUM  --  8.5* 8.5*  MG 1.9  --  2.0   Liver Function Tests Recent Labs    02/17/19 0652  AST 52*  ALT 28  ALKPHOS 63  BILITOT 0.7  PROT 6.0*  ALBUMIN 3.1*   No results for input(s): LIPASE, AMYLASE in the last 72 hours. Cardiac Enzymes No results for input(s): CKTOTAL, CKMB, CKMBINDEX, TROPONINI in the last 72 hours. BNP Invalid input(s): POCBNP D-Dimer No results for input(s): DDIMER in the last 72 hours. Hemoglobin A1C Recent Labs    02/17/19 0652  HGBA1C 5.9*   Fasting Lipid Panel Recent Labs    02/16/19 0205  CHOL 144  HDL 48  LDLCALC 79  TRIG 83  CHOLHDL 3.0   Thyroid Function Tests Recent Labs    02/15/19 1844  TSH 1.050   _____________  Dg Chest 2 View  Result Date: 02/15/2019 CLINICAL DATA:  New onset chest pain EXAM: CHEST - 2 VIEW COMPARISON:  09/07/2011 FINDINGS: Normal heart size and mediastinal contours. No acute infiltrate or edema. No effusion or pneumothorax. No acute osseous findings. IMPRESSION: No active cardiopulmonary disease. Electronically Signed   By: Marnee SpringJonathon  Watts M.D.   On: 02/15/2019 11:17   Disposition   Patient is being discharged home today in good condition.  Follow-up Plans & Appointments    Follow-up  Information    Rollene RotundaHochrein, James, MD Follow up.   Specialty: Cardiology Why: Our office will call you to schedule follow-up appointment within 1-2 weeks. If you do not hear from us by Wednesday 02/21/2019, please call our office. Contact information: [address] [place] [id number] [phone]          Discharge Instructions    Amb Referral to Cardiac Rehabilitation   Complete by: As directed    Diagnosis: Coronary Stents   After initial evaluation and assessments completed: Virtual Based Care may be provided alone or in conjunction with Phase 2 Cardiac Rehab based on patient barriers.: Yes   Diet - low sodium heart healthy   Complete by: As directed    Increase activity slowly   Complete by: As directed    Increase activity slowly   Complete by: As directed       Discharge  Medications   Allergies as of 02/17/2019   No Known Allergies     Medication List    TAKE these medications   amLODipine 5 MG tablet Commonly known as: NORVASC Take 5 mg by mouth daily.   aspirin 81 MG EC tablet Take 1 tablet (81 mg total) by mouth daily. Start taking on: February 18, 2019   atorvastatin 40 MG tablet Commonly known as: LIPITOR Take 1 tablet (40 mg total) by mouth daily at 6 PM.   metoprolol tartrate 25 MG tablet Commonly known as: LOPRESSOR Take 1 tablet (25 mg total) by mouth 2 (two) times daily.   multivitamin with minerals Tabs tablet Take 1 tablet by mouth daily.   nitroGLYCERIN 0.4 MG SL tablet Commonly known as: NITROSTAT Place 1 tablet (0.4 mg total) under the tongue every 5 (five) minutes x 3 doses as needed for chest pain.   OVER THE COUNTER MEDICATION Take 1 capsule by mouth 3 (three) times a week. Super Beta Prostate   ticagrelor 90 MG Tabs tablet Commonly known as: BRILINTA Take 1 tablet (90 mg total) by mouth 2 (two) times daily.        Acute coronary syndrome (MI, NSTEMI, STEMI, etc) this admission?: Yes.     AHA/ACC Clinical Performance  & Quality Measures: 1. Aspirin prescribed? - Yes 2. ADP Receptor Inhibitor (Plavix/Clopidogrel, Brilinta/Ticagrelor or Effient/Prasugrel) prescribed (includes medically managed patients)? - Yes 3. Beta Blocker prescribed? - Yes 4. High Intensity Statin (Lipitor 40-80mg  or Crestor 20-40mg ) prescribed? - Yes 5. EF assessed during THIS hospitalization? - Yes (during cardiac catheterization) 6. For EF <40%, was ACEI/ARB prescribed? - Not Applicable (EF >/= 26%) 7. For EF <40%, Aldosterone Antagonist (Spironolactone or Eplerenone) prescribed? - Not Applicable (EF >/= 94%) 8. Cardiac Rehab Phase II ordered (Included Medically managed Patients)? - Yes     Outstanding Labs/Studies   Will need repeat lipid panel and CMP in 6 weeks.  Duration of Discharge Encounter   Greater than 30 minutes including physician time.  Signed, Darreld Mclean, PA-C 02/17/2019, 11:27 AM

## 2019-02-19 ENCOUNTER — Encounter (HOSPITAL_COMMUNITY): Payer: Self-pay | Admitting: Interventional Cardiology

## 2019-02-20 ENCOUNTER — Telehealth (HOSPITAL_COMMUNITY): Payer: Self-pay

## 2019-02-20 NOTE — Telephone Encounter (Signed)
Dr. Percival Spanish  As you are aware our department remains closed to patients due to Covid-19.  We are excited to be able to offer an alternative to traditional onsite Cardiac Rehab while your patient continues to follow Re-Open guidelines. This is a notification that your patient has been contacted and is very interested in participating in Virtual Cardiac Rehab.  Thank you for your continued support in helping [place] meet the health care needs of our patients.  [facility]  Cardiac Rehab staff

## 2019-03-12 ENCOUNTER — Telehealth: Payer: Self-pay

## 2019-03-12 NOTE — Progress Notes (Signed)
Cardiology Office Note   Date:  03/13/2019   ID:  Robinson, Brinkley 1951/06/07, MRN [id number]  PCP:  Marda Stalker, PA-C  Cardiologist:   Minus Breeding, MD   Chief Complaint  Patient presents with  . Coronary Artery Disease      History of Present Illness: Bruce Spencer is a 68 y.o. male who presents for follow up of NQWMI with results as below.  Since he was last seen in the hospital he is done quite well.  He has been doing his yard work. The patient denies any new symptoms such as chest discomfort, neck or arm discomfort. There has been no new shortness of breath, PND or orthopnea. There have been no reported palpitations, presyncope or syncope.      Past Medical History:  Diagnosis Date  . CAD (coronary artery disease)    Stent to Circ 2002  . Hypertension 2015  . Obesity     Past Surgical History:  Procedure Laterality Date  . CORONARY STENT INTERVENTION N/A 02/16/2019   Procedure: CORONARY STENT INTERVENTION;  Surgeon: Jettie Booze, MD;  Location: [place] [facility];  Service: Cardiovascular;  Laterality: N/A;  . LEFT HEART CATH AND CORONARY ANGIOGRAPHY N/A 02/16/2019   Procedure: LEFT HEART CATH AND CORONARY ANGIOGRAPHY;  Surgeon: Jettie Booze, MD;  Location: [place] [facility];  Service: Cardiovascular;  Laterality: N/A;  . None       Current Outpatient Medications  Medication Sig Dispense Refill  . amLODipine (NORVASC) 5 MG tablet Take 1 tablet (5 mg total) by mouth daily. 90 tablet 1  . aspirin EC 81 MG EC tablet Take 1 tablet (81 mg total) by mouth daily.    Marland Kitchen atorvastatin (LIPITOR) 40 MG tablet Take 1 tablet (40 mg total) by mouth daily at 6 PM. 90 tablet 1  . metoprolol tartrate (LOPRESSOR) 25 MG tablet Take 1 tablet (25 mg total) by mouth 2 (two) times daily. 180 tablet 1  . Multiple Vitamin (MULTIVITAMIN WITH MINERALS) TABS tablet Take 1 tablet by mouth daily.    . nitroGLYCERIN (NITROSTAT) 0.4 MG SL tablet Place 1 tablet  (0.4 mg total) under the tongue every 5 (five) minutes x 3 doses as needed for chest pain. 25 tablet 1  . OVER THE COUNTER MEDICATION Take 1 capsule by mouth 3 (three) times a week. Super Beta Prostate    . ticagrelor (BRILINTA) 90 MG TABS tablet Take 1 tablet (90 mg total) by mouth 2 (two) times daily. 90 tablet 1   No current facility-administered medications for this visit.     Allergies:   Patient has no known allergies.     ROS:  Please see the history of present illness.   Otherwise, review of systems are positive for none.   All other systems are reviewed and negative.    PHYSICAL EXAM: VS:  BP (!) 122/58   Pulse 62   Temp 97.6 F (36.4 C)   Ht 5' 10.5" (1.791 m)   Wt 231 lb 9.6 oz (105.1 kg)   SpO2 97%   BMI 32.76 kg/m  , BMI Body mass index is 32.76 kg/m. GENERAL:  Well appearing NECK:  No jugular venous distention, waveform within normal limits, carotid upstroke brisk and symmetric, no bruits, no thyromegaly LUNGS:  Clear to auscultation bilaterally BACK:  No CVA tenderness CHEST:  Unremarkable HEART:  PMI not displaced or sustained,S1 and S2 within normal limits, no S3, no S4, no clicks, no rubs,  no murmurs ABD:  Flat, positive bowel sounds normal in frequency in pitch, no bruits, no rebound, no guarding, no midline pulsatile mass, no hepatomegaly, no splenomegaly EXT:  2 plus pulses throughout, no edema, no cyanosis no clubbing   EKG:  EKG is ordered today. The ekg ordered today demonstrates sinus rhythm, rate 59, axis within normal limits, intervals within normal limits, early transition V2, no acute ST-T wave changes.   Recent Labs: 02/15/2019: TSH 1.050 02/17/2019: ALT 28; BUN 8; Creatinine, Ser 0.90; Hemoglobin 13.8; Magnesium 2.0; Platelets 201; Potassium 4.3; Sodium 138    Lipid Panel    Component Value Date/Time   CHOL 144 02/16/2019 0205   TRIG 83 02/16/2019 0205   HDL 48 02/16/2019 0205   CHOLHDL 3.0 02/16/2019 0205   VLDL 17 02/16/2019 0205    LDLCALC 79 02/16/2019 0205      Wt Readings from Last 3 Encounters:  03/13/19 231 lb 9.6 oz (105.1 kg)  02/17/19 231 lb 1.6 oz (104.8 kg)  06/08/16 248 lb (112.5 kg)     CATH  02/16/19   Intervention     Other studies Reviewed: Additional studies/ records that were reviewed today include: No. Review of the above records demonstrates:  Please see elsewhere in the note.     ASSESSMENT AND PLAN:  CAD:  The patient has no new sypmtoms.  No further cardiovascular testing is indicated.  We will continue with aggressive risk reduction and meds as listed.    HTN:  The blood pressure is at target. No change in medications is indicated. We will continue with therapeutic lifestyle changes (TLC).  RISK REDUCTION: We will continue the current dose of Lipitor and should get a lipid profile when he returns in 6 months.     Current medicines are reviewed at length with the patient today.  The patient does not have concerns regarding medicines.  The following changes have been made:  no change  Labs/ tests ordered today include: None  Orders Placed This Encounter  Procedures  . EKG 12-Lead     Disposition:   FU with APP or me in the office in six months.     Signed, Rollene RotundaJames Jakobi Thetford, MD  03/13/2019 2:54 PM     [facility]

## 2019-03-12 NOTE — Telephone Encounter (Signed)
[facility] APPT 7-21 @140PM  LM2CB FOR COVID QUESTIONS

## 2019-03-13 ENCOUNTER — Other Ambulatory Visit: Payer: Self-pay

## 2019-03-13 ENCOUNTER — Encounter: Payer: Self-pay | Admitting: Cardiology

## 2019-03-13 ENCOUNTER — Ambulatory Visit (INDEPENDENT_AMBULATORY_CARE_PROVIDER_SITE_OTHER): Payer: Medicare HMO | Admitting: Cardiology

## 2019-03-13 VITALS — BP 122/58 | HR 62 | Temp 97.6°F | Ht 70.5 in | Wt 231.6 lb

## 2019-03-13 DIAGNOSIS — I2510 Atherosclerotic heart disease of native coronary artery without angina pectoris: Secondary | ICD-10-CM | POA: Diagnosis not present

## 2019-03-13 DIAGNOSIS — I10 Essential (primary) hypertension: Secondary | ICD-10-CM

## 2019-03-13 MED ORDER — ATORVASTATIN CALCIUM 40 MG PO TABS: 40.0000 mg | ORAL_TABLET | Freq: Every day | ORAL | 1 refills | Status: DC

## 2019-03-13 MED ORDER — TICAGRELOR 90 MG PO TABS: 90.0000 mg | ORAL_TABLET | Freq: Two times a day (BID) | ORAL | 0 refills | Status: DC

## 2019-03-13 MED ORDER — AMLODIPINE BESYLATE 5 MG PO TABS: 5.0000 mg | ORAL_TABLET | Freq: Every day | ORAL | 1 refills | Status: DC

## 2019-03-13 MED ORDER — TICAGRELOR 90 MG PO TABS: 90.0000 mg | ORAL_TABLET | Freq: Two times a day (BID) | ORAL | 1 refills | Status: DC

## 2019-03-13 MED ORDER — METOPROLOL TARTRATE 25 MG PO TABS: 25.0000 mg | ORAL_TABLET | Freq: Two times a day (BID) | ORAL | 1 refills | Status: DC

## 2019-03-13 NOTE — Telephone Encounter (Signed)
LM2CB-COVID QUESTIONS

## 2019-03-13 NOTE — Patient Instructions (Addendum)
Follow-Up: You will need a follow up appointment in 6 months WITH APP.  Please call our office 2 months in advance, November 2020 to schedule this January 2021 appointment.  You may see Minus Breeding, MD or one of the following Advanced Practice Providers on your designated Care Team:  Rosaria Ferries, PA-C  Jory Sims, DNP, ANP       Medication Instructions:  The current medical regimen is effective;  continue present plan and medications as directed. Please refer to the Current Medication list given to you today. If you need a refill on your cardiac medications before your next appointment, please call your pharmacy. Labwork: When you have labs (blood work) and your tests are completely normal, you will receive your results ONLY by [place] (if you have MyChart) -OR- A paper copy in the mail.  At [facility], you and your health needs are our priority.  As part of our continuing mission to provide you with exceptional heart care, we have created designated Provider Care Teams.  These Care Teams include your primary Cardiologist (physician) and Advanced Practice Providers (APPs -  Physician Assistants and Nurse Practitioners) who all work together to provide you with the care you need, when you need it.  Thank you for choosing [facility] at [facility]!!

## 2019-03-15 ENCOUNTER — Encounter (HOSPITAL_COMMUNITY): Payer: Self-pay | Admitting: *Deleted

## 2019-03-15 NOTE — Progress Notes (Signed)
Referral received for CR.  Clinical review of pt's follow up appt  cardiologist office note on 03/13/2019. Pt is making the expected progress in recovery. Will forward to support staff to reach out to patient for scheduling.

## 2019-03-23 ENCOUNTER — Telehealth (HOSPITAL_COMMUNITY): Payer: Self-pay

## 2019-05-31 DIAGNOSIS — Z23 Encounter for immunization: Secondary | ICD-10-CM | POA: Diagnosis not present

## 2019-06-29 ENCOUNTER — Other Ambulatory Visit: Payer: Self-pay | Admitting: *Deleted

## 2019-06-29 MED ORDER — TICAGRELOR 90 MG PO TABS: 90.0000 mg | ORAL_TABLET | Freq: Two times a day (BID) | ORAL | 1 refills | Status: DC

## 2019-06-29 NOTE — Telephone Encounter (Signed)
Rx request sent to pharmacy.  

## 2019-08-14 DIAGNOSIS — M545 Low back pain: Secondary | ICD-10-CM | POA: Diagnosis not present

## 2019-09-06 DIAGNOSIS — I2510 Atherosclerotic heart disease of native coronary artery without angina pectoris: Secondary | ICD-10-CM | POA: Insufficient documentation

## 2019-09-06 DIAGNOSIS — Z7189 Other specified counseling: Secondary | ICD-10-CM | POA: Insufficient documentation

## 2019-09-06 NOTE — Progress Notes (Signed)
Cardiology Office Note   Date:  09/07/2019   ID:  Bruce, Spencer 06-Mar-1951, MRN [id number]  PCP:  Jarrett Soho, PA-C  Cardiologist:   Rollene Rotunda, MD   Chief Complaint  Patient presents with  . Coronary Artery Disease      History of Present Illness: SEMISI Spencer is a 69 y.o. male who presents follow up of a NQWMI: He had previous stenting to the circumflex.  He works at [employer]  He does a lot of walking lifting and pushing.  With this he denies any cardiovascular symptoms.  He has no chest pressure, neck or arm discomfort.  He has no palpitations, presyncope or syncope.  He has no weight gain or edema.  He has been trying to participate in risk reduction we could probably do better with his diet.  He has had none of the symptoms that was his previous angina.  Past Medical History:  Diagnosis Date  . CAD (coronary artery disease)    Stent to Circ 2002  . Hypertension 2015  . Obesity     Past Surgical History:  Procedure Laterality Date  . CORONARY STENT INTERVENTION N/A 02/16/2019   Procedure: CORONARY STENT INTERVENTION;  Surgeon: Corky Crafts, MD;  Location: [facility];  Service: Cardiovascular;  Laterality: N/A;  . LEFT HEART CATH AND CORONARY ANGIOGRAPHY N/A 02/16/2019   Procedure: LEFT HEART CATH AND CORONARY ANGIOGRAPHY;  Surgeon: Corky Crafts, MD;  Location: [facility] INVASIVE CV LAB;  Service: Cardiovascular;  Laterality: N/A;  . None       Current Outpatient Medications  Medication Sig Dispense Refill  . amLODipine (NORVASC) 5 MG tablet Take 1 tablet (5 mg total) by mouth daily. 90 tablet 1  . aspirin EC 81 MG EC tablet Take 1 tablet (81 mg total) by mouth daily.    Marland Kitchen atorvastatin (LIPITOR) 40 MG tablet Take 1 tablet (40 mg total) by mouth daily at 6 PM. 90 tablet 1  . metoprolol tartrate (LOPRESSOR) 25 MG tablet Take 1 tablet (25 mg total) by mouth 2 (two) times daily. 180 tablet 1  . Multiple Vitamin (MULTIVITAMIN WITH  MINERALS) TABS tablet Take 1 tablet by mouth daily.    . nitroGLYCERIN (NITROSTAT) 0.4 MG SL tablet Place 1 tablet (0.4 mg total) under the tongue every 5 (five) minutes x 3 doses as needed for chest pain. 25 tablet 1  . OVER THE COUNTER MEDICATION Take 1 capsule by mouth 3 (three) times a week. Super Beta Prostate    . ticagrelor (BRILINTA) 90 MG TABS tablet Take 1 tablet (90 mg total) by mouth 2 (two) times daily. 90 tablet 1   No current facility-administered medications for this visit.    Allergies:   Patient has no known allergies.     ROS:  Please see the history of present illness.   Otherwise, review of systems are positive for none.   All other systems are reviewed and negative.    PHYSICAL EXAM: VS:  BP 126/84   Pulse 63   Ht 5' 10.5" (1.791 m)   Wt 243 lb (110.2 kg)   BMI 34.37 kg/m  , BMI Body mass index is 34.37 kg/m. GENERAL:  Well appearing NECK:  No jugular venous distention, waveform within normal limits, carotid upstroke brisk and symmetric, no bruits, no thyromegaly LUNGS:  Clear to auscultation bilaterally BACK:  No CVA tenderness CHEST:  Unremarkable HEART:  PMI not displaced or sustained,S1 and S2 within normal limits,  no S3, no S4, no clicks, no rubs, no murmurs ABD:  Flat, positive bowel sounds normal in frequency in pitch, no bruits, no rebound, no guarding, no midline pulsatile mass, no hepatomegaly, no splenomegaly EXT:  2 plus pulses throughout, no edema, no cyanosis no clubbing   EKG:  EKG is ordered today. The ekg ordered today demonstrates sinus rhythm, rate 63, axis within normal limits, intervals within normal limits, early transition V2, no acute ST-T wave changes.   Recent Labs: 02/15/2019: TSH 1.050 02/17/2019: ALT 28; BUN 8; Creatinine, Ser 0.90; Hemoglobin 13.8; Magnesium 2.0; Platelets 201; Potassium 4.3; Sodium 138    Lipid Panel    Component Value Date/Time   CHOL 144 02/16/2019 0205   TRIG 83 02/16/2019 0205   HDL 48 02/16/2019  0205   CHOLHDL 3.0 02/16/2019 0205   VLDL 17 02/16/2019 0205   LDLCALC 79 02/16/2019 0205      Wt Readings from Last 3 Encounters:  09/07/19 243 lb (110.2 kg)  03/13/19 231 lb 9.6 oz (105.1 kg)  02/17/19 231 lb 1.6 oz (104.8 kg)     CATH  02/16/19   Intervention     Other studies Reviewed: Additional studies/ records that were reviewed today include: No. Review of the above records demonstrates:  Please see elsewhere in the note.     ASSESSMENT AND PLAN:  CAD:  The patient has no new sypmtoms.  No further cardiovascular testing is indicated.  We will continue with aggressive risk reduction and meds as listed.  He can stop his Brilinta in June.  HTN:  The blood pressure is at target.  No change in therapy.   DYSLIPIDEMIA: His LDL was very slightly above target at 79 with an HDL of 48.  No change in therapy.   COVID EDUCATION: We talked about getting the vaccine.   Current medicines are reviewed at length with the patient today.  The patient does not have concerns regarding medicines.  The following changes have been made:  no change  Labs/ tests ordered today include: None  Orders Placed This Encounter  Procedures  . EKG 12-Lead     Disposition:   FU with APP or me in the office in six months.     Signed, Minus Breeding, MD  09/07/2019 4:37 PM    [facility]

## 2019-09-07 ENCOUNTER — Other Ambulatory Visit: Payer: Self-pay

## 2019-09-07 ENCOUNTER — Ambulatory Visit: Payer: Medicare HMO | Admitting: Cardiology

## 2019-09-07 ENCOUNTER — Encounter: Payer: Self-pay | Admitting: Cardiology

## 2019-09-07 VITALS — BP 126/84 | HR 63 | Ht 70.5 in | Wt 243.0 lb

## 2019-09-07 DIAGNOSIS — Z7189 Other specified counseling: Secondary | ICD-10-CM | POA: Diagnosis not present

## 2019-09-07 DIAGNOSIS — I10 Essential (primary) hypertension: Secondary | ICD-10-CM | POA: Diagnosis not present

## 2019-09-07 DIAGNOSIS — I2510 Atherosclerotic heart disease of native coronary artery without angina pectoris: Secondary | ICD-10-CM | POA: Diagnosis not present

## 2019-09-07 NOTE — Patient Instructions (Signed)
Medication Instructions:  No changes *If you need a refill on your cardiac medications before your next appointment, please call your pharmacy*  Lab Work: None  Testing/Procedures: None  Follow-Up: At [facility], you and your health needs are our priority.  As part of our continuing mission to provide you with exceptional heart care, we have created designated Provider Care Teams.  These Care Teams include your primary Cardiologist (physician) and Advanced Practice Providers (APPs -  Physician Assistants and Nurse Practitioners) who all work together to provide you with the care you need, when you need it.  Your next appointment:   1 year(s) You will receive a reminder letter in the mail two months in advance. If you don't receive a letter, please call our office to schedule the follow-up appointment.  The format for your next appointment:   In Person  Provider:   Rollene Rotunda, MD  Other Instructions [facility]

## 2019-09-15 ENCOUNTER — Ambulatory Visit: Payer: Medicare HMO | Attending: Internal Medicine

## 2019-09-15 ENCOUNTER — Ambulatory Visit: Payer: Medicare HMO

## 2019-09-15 DIAGNOSIS — Z23 Encounter for immunization: Secondary | ICD-10-CM | POA: Insufficient documentation

## 2019-09-15 NOTE — Progress Notes (Signed)
   Covid-19 Vaccination Clinic  Name:  Bruce Spencer    MRN: [id number] DOB: Oct 18, 1950  09/15/2019  Mr. Bruce Spencer was observed post Covid-19 immunization for 15 minutes without incidence. He was provided with Vaccine Information Sheet and instruction to access the V-Safe system.   Mr. Bruce Spencer was instructed to call 911 with any severe reactions post vaccine: Marland Kitchen Difficulty breathing  . Swelling of your face and throat  . A fast heartbeat  . A bad rash all over your body  . Dizziness and weakness    Immunizations Administered    Name Date Dose VIS Date Route   Pfizer COVID-19 Vaccine 09/15/2019  2:32 PM 0.3 mL 08/03/2019 Intramuscular   Manufacturer: [employer], [employer]   Lot: [id number]   NDC: [address]-1

## 2019-09-28 ENCOUNTER — Other Ambulatory Visit: Payer: Self-pay | Admitting: Cardiology

## 2019-10-07 ENCOUNTER — Ambulatory Visit: Payer: Medicare HMO | Attending: Internal Medicine

## 2019-10-07 DIAGNOSIS — Z23 Encounter for immunization: Secondary | ICD-10-CM

## 2019-10-07 NOTE — Progress Notes (Signed)
   Covid-19 Vaccination Clinic  Name:  Bruce Spencer    MRN: [id number] DOB: 06/24/1951  10/07/2019  [name] was observed post Covid-19 immunization for 15 minutes without incidence. He was provided with Vaccine Information Sheet and instruction to access the V-Safe system.   [name] was instructed to call 911 with any severe reactions post vaccine: Marland Kitchen Difficulty breathing  . Swelling of your face and throat  . A fast heartbeat  . A bad rash all over your body  . Dizziness and weakness    Immunizations Administered    Name Date Dose VIS Date Route   Pfizer COVID-19 Vaccine 10/07/2019 10:28 AM 0.3 mL 08/03/2019 Intramuscular   Manufacturer: [employer], [employer]   Lot: [id number]   NDC: [address]-0

## 2019-12-20 ENCOUNTER — Other Ambulatory Visit: Payer: Self-pay | Admitting: Cardiology

## 2020-05-15 DIAGNOSIS — Z125 Encounter for screening for malignant neoplasm of prostate: Secondary | ICD-10-CM | POA: Diagnosis not present

## 2020-05-15 DIAGNOSIS — D508 Other iron deficiency anemias: Secondary | ICD-10-CM | POA: Diagnosis not present

## 2020-05-15 DIAGNOSIS — I10 Essential (primary) hypertension: Secondary | ICD-10-CM | POA: Diagnosis not present

## 2020-05-15 DIAGNOSIS — R739 Hyperglycemia, unspecified: Secondary | ICD-10-CM | POA: Diagnosis not present

## 2020-05-23 DIAGNOSIS — N529 Male erectile dysfunction, unspecified: Secondary | ICD-10-CM | POA: Diagnosis not present

## 2020-05-23 DIAGNOSIS — E1159 Type 2 diabetes mellitus with other circulatory complications: Secondary | ICD-10-CM | POA: Diagnosis not present

## 2020-05-23 DIAGNOSIS — I10 Essential (primary) hypertension: Secondary | ICD-10-CM | POA: Diagnosis not present

## 2020-05-23 DIAGNOSIS — L738 Other specified follicular disorders: Secondary | ICD-10-CM | POA: Diagnosis not present

## 2020-05-23 DIAGNOSIS — Z0000 Encounter for general adult medical examination without abnormal findings: Secondary | ICD-10-CM | POA: Diagnosis not present

## 2020-05-23 DIAGNOSIS — Z23 Encounter for immunization: Secondary | ICD-10-CM | POA: Diagnosis not present

## 2020-05-23 DIAGNOSIS — I2510 Atherosclerotic heart disease of native coronary artery without angina pectoris: Secondary | ICD-10-CM | POA: Diagnosis not present

## 2020-07-11 DIAGNOSIS — H401131 Primary open-angle glaucoma, bilateral, mild stage: Secondary | ICD-10-CM | POA: Diagnosis not present

## 2020-07-26 ENCOUNTER — Other Ambulatory Visit: Payer: Self-pay | Admitting: Cardiology

## 2020-08-01 DIAGNOSIS — H401113 Primary open-angle glaucoma, right eye, severe stage: Secondary | ICD-10-CM | POA: Diagnosis not present

## 2020-08-02 IMAGING — CR CHEST - 2 VIEW
2 series · 2 of 2 positions shown · non-contrast
Comparison: 09/07/2011

CLINICAL DATA: New onset chest pain

EXAM:
CHEST - 2 VIEW

[w chest pa]
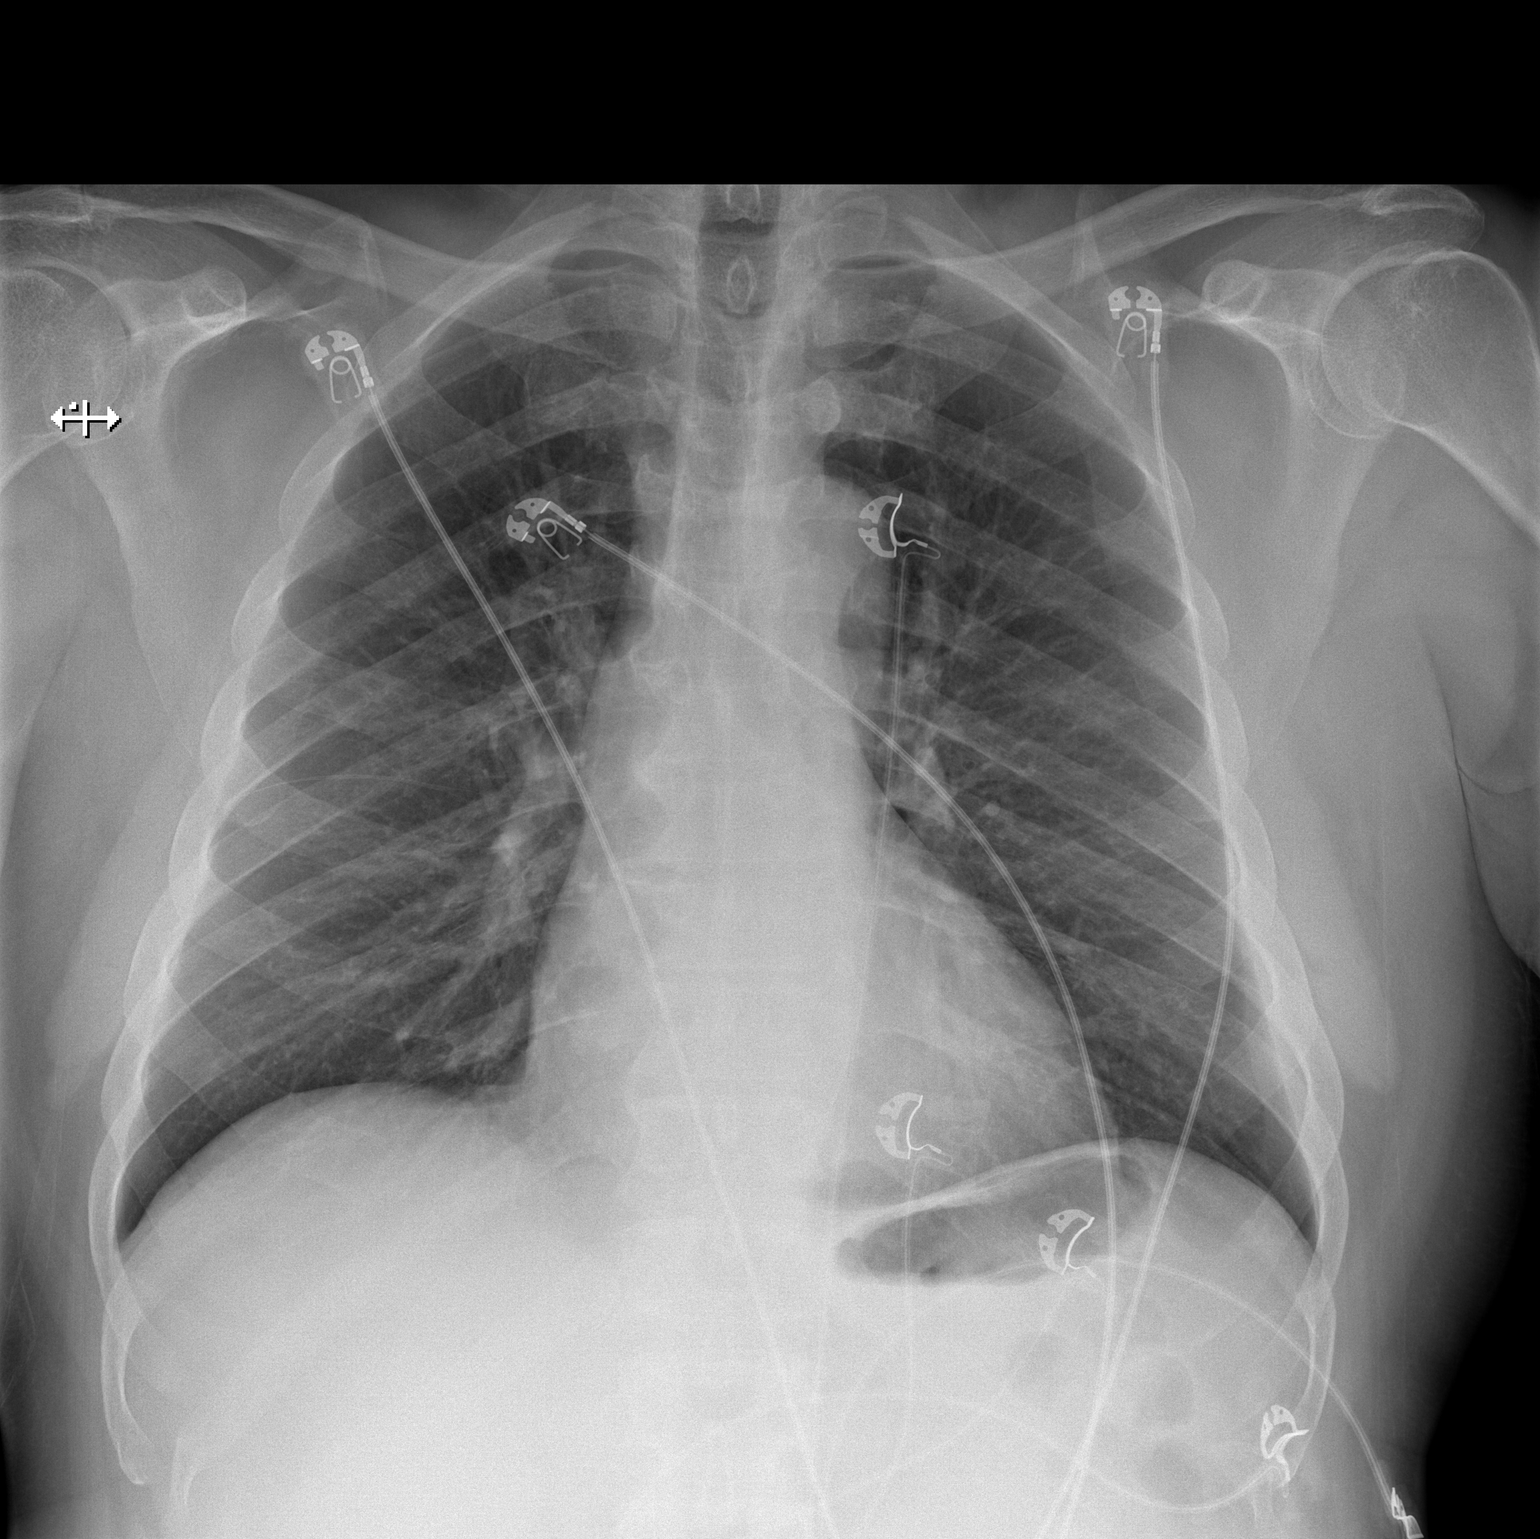

[w chest lat]
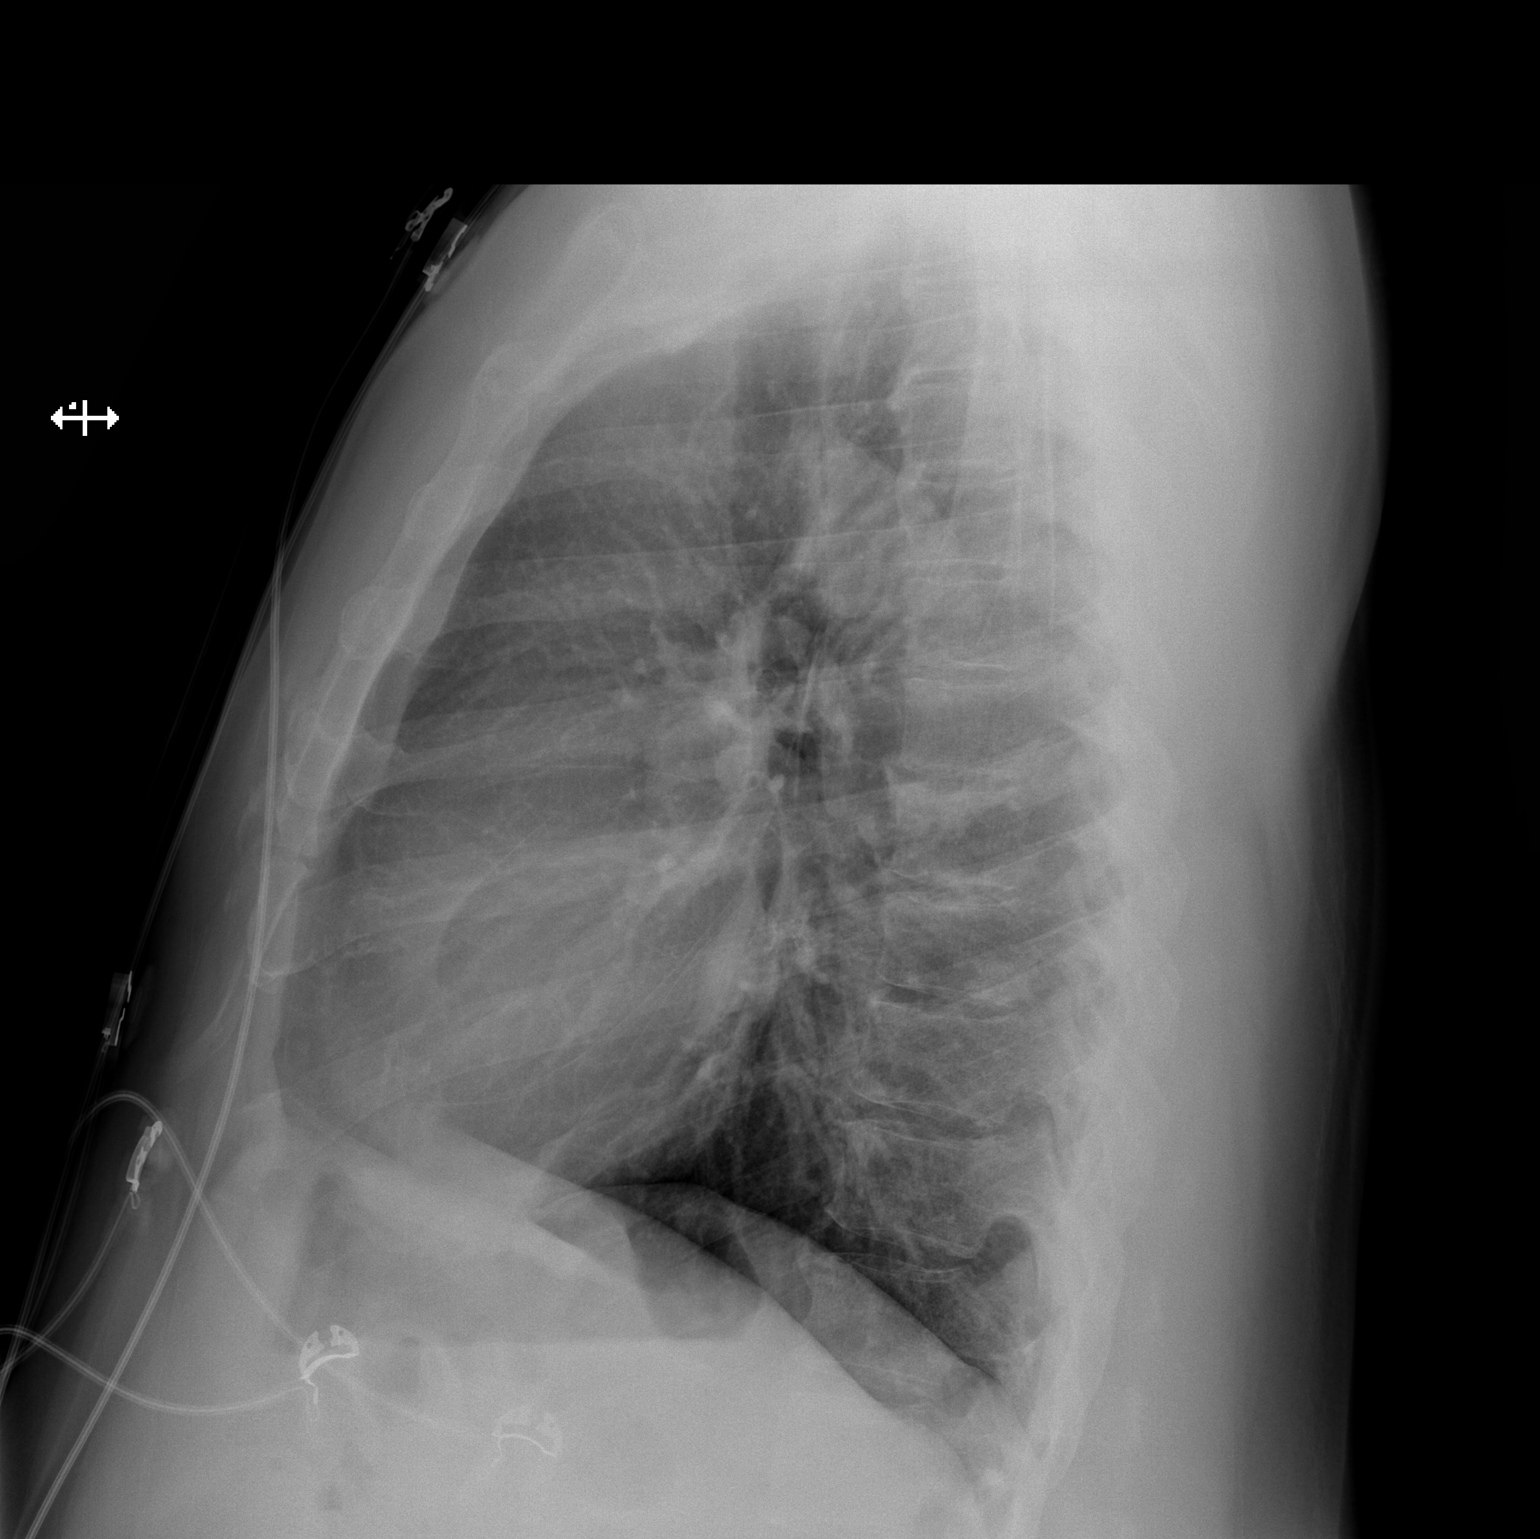

[2 of 2 positions shown; findings below may reference images not displayed]

FINDINGS: Normal heart size and mediastinal contours. No acute infiltrate or
edema. No effusion or pneumothorax. No acute osseous findings.
IMPRESSION: No active cardiopulmonary disease.

## 2020-09-09 ENCOUNTER — Ambulatory Visit: Payer: Medicare HMO | Admitting: Cardiology

## 2020-09-30 ENCOUNTER — Other Ambulatory Visit: Payer: Self-pay | Admitting: Cardiology

## 2020-12-09 DIAGNOSIS — H401113 Primary open-angle glaucoma, right eye, severe stage: Secondary | ICD-10-CM | POA: Diagnosis not present

## 2020-12-17 ENCOUNTER — Other Ambulatory Visit: Payer: Self-pay | Admitting: Cardiology

## 2020-12-18 ENCOUNTER — Other Ambulatory Visit: Payer: Self-pay | Admitting: Cardiology

## 2020-12-19 ENCOUNTER — Ambulatory Visit: Payer: Medicare HMO | Admitting: Cardiology

## 2020-12-24 DIAGNOSIS — E785 Hyperlipidemia, unspecified: Secondary | ICD-10-CM | POA: Insufficient documentation

## 2020-12-24 NOTE — Progress Notes (Signed)
Cardiology Office Note   Date:  12/25/2020   ID:  Khing, Belcher March 25, 1951, MRN [id number]  PCP:  Jarrett Soho, PA-C  Cardiologist:   Rollene Rotunda, MD   Chief Complaint  Patient presents with  . Coronary Artery Disease      History of Present Illness: Bruce Spencer is a 69 y.o. male who presents follow up of a NQWMI: He had previous stenting to the circumflex.   Since I last saw him.  Still working at [employer]  He is not eating what I would like him to eat.  His weight has gone up a little bit.  He has had none of the extreme fatigue that was his anginal. The patient denies any new symptoms such as chest discomfort, neck or arm discomfort. There has been no new shortness of breath, PND or orthopnea. There have been no reported palpitations, presyncope or syncope.    Past Medical History:  Diagnosis Date  . CAD (coronary artery disease)    Stent to Circ 2002  . Hypertension 2015  . Obesity     Past Surgical History:  Procedure Laterality Date  . CORONARY STENT INTERVENTION N/A 02/16/2019   Procedure: CORONARY STENT INTERVENTION;  Surgeon: Corky Crafts, MD;  Location: [facility] INVASIVE CV LAB;  Service: Cardiovascular;  Laterality: N/A;  . LEFT HEART CATH AND CORONARY ANGIOGRAPHY N/A 02/16/2019   Procedure: LEFT HEART CATH AND CORONARY ANGIOGRAPHY;  Surgeon: Corky Crafts, MD;  Location: [facility] INVASIVE CV LAB;  Service: Cardiovascular;  Laterality: N/A;  . None       Current Outpatient Medications  Medication Sig Dispense Refill  . amLODipine (NORVASC) 5 MG tablet Take 1 tablet (5 mg total) by mouth daily. pls advise pt to keep upcoming appt in May for further refills. thx 30 tablet 0  . aspirin EC 81 MG EC tablet Take 1 tablet (81 mg total) by mouth daily.    Marland Kitchen atorvastatin (LIPITOR) 40 MG tablet TAKE 1 TABLET EVERY DAY AT 6 PM. 90 tablet 1  . Ferrous Sulfate (IRON PO) Take 1 tablet by mouth. Every 6-8 weeks    . metoprolol tartrate (LOPRESSOR) 25  MG tablet TAKE 1 TABLET TWICE DAILY 15 tablet 0  . Multiple Vitamin (MULTIVITAMIN WITH MINERALS) TABS tablet Take 1 tablet by mouth daily.    . nitroGLYCERIN (NITROSTAT) 0.4 MG SL tablet Place 1 tablet (0.4 mg total) under the tongue every 5 (five) minutes x 3 doses as needed for chest pain. 25 tablet 1  . OVER THE COUNTER MEDICATION Take 1 capsule by mouth 3 (three) times a week. Super Beta Prostate     No current facility-administered medications for this visit.    Allergies:   Patient has no known allergies.     ROS:  Please see the history of present illness.   Otherwise, review of systems are positive for none.   All other systems are reviewed and negative.    PHYSICAL EXAM: VS:  BP 126/82   Pulse 60   Ht 5' 10.5" (1.791 m)   Wt 245 lb (111.1 kg)   SpO2 95%   BMI 34.66 kg/m  , BMI Body mass index is 34.66 kg/m. GENERAL:  Well appearing NECK:  No jugular venous distention, waveform within normal limits, carotid upstroke brisk and symmetric, no bruits, no thyromegaly LUNGS:  Clear to auscultation bilaterally CHEST:  Unremarkable HEART:  PMI not displaced or sustained,S1 and S2 within normal limits, no S3, no S4,  no clicks, no rubs, no murmurs ABD:  Flat, positive bowel sounds normal in frequency in pitch, no bruits, no rebound, no guarding, no midline pulsatile mass, no hepatomegaly, no splenomegaly EXT:  2 plus pulses throughout, no edema, no cyanosis no clubbing   EKG:  EKG is  ordered today. The ekg ordered today demonstrates sinus rhythm, rate 60, axis within normal limits, intervals within normal limits, early transition V2, no acute ST-T wave changes.   Recent Labs: No results found for requested labs within last 8760 hours.    Lipid Panel    Component Value Date/Time   CHOL 144 02/16/2019 0205   TRIG 83 02/16/2019 0205   HDL 48 02/16/2019 0205   CHOLHDL 3.0 02/16/2019 0205   VLDL 17 02/16/2019 0205   LDLCALC 79 02/16/2019 0205      Wt Readings from Last  3 Encounters:  12/25/20 245 lb (111.1 kg)  09/07/19 243 lb (110.2 kg)  03/13/19 231 lb 9.6 oz (105.1 kg)     CATH  02/16/19   Intervention     Other studies Reviewed: Additional studies/ records that were reviewed today include: No. Review of the above records demonstrates:  Please see elsewhere in the note.     ASSESSMENT AND PLAN:  CAD:  The patient has no new sypmtoms.  No further cardiovascular testing is indicated.  We will continue with aggressive risk reduction and meds as listed.  We talked quite a bit about diet and exercise in the past.  HTN:  The blood pressure is at target.    DYSLIPIDEMIA: His LDL is 36 with an HDL of 43.  No change in therapy.    Current medicines are reviewed at length with the patient today.  The patient does not have concerns regarding medicines.  The following changes have been made:  no change  Labs/ tests ordered today include: None  Orders Placed This Encounter  Procedures  . EKG 12-Lead     Disposition:   FU with me in 12 months.     Signed, Rollene Rotunda, MD  12/25/2020 1:07 PM    [facility]

## 2020-12-25 ENCOUNTER — Ambulatory Visit: Payer: Medicare HMO | Admitting: Cardiology

## 2020-12-25 ENCOUNTER — Encounter: Payer: Self-pay | Admitting: Cardiology

## 2020-12-25 ENCOUNTER — Other Ambulatory Visit: Payer: Self-pay

## 2020-12-25 VITALS — BP 126/82 | HR 60 | Ht 70.5 in | Wt 245.0 lb

## 2020-12-25 DIAGNOSIS — I2510 Atherosclerotic heart disease of native coronary artery without angina pectoris: Secondary | ICD-10-CM | POA: Diagnosis not present

## 2020-12-25 DIAGNOSIS — I10 Essential (primary) hypertension: Secondary | ICD-10-CM | POA: Diagnosis not present

## 2020-12-25 DIAGNOSIS — E785 Hyperlipidemia, unspecified: Secondary | ICD-10-CM

## 2020-12-25 NOTE — Patient Instructions (Signed)
Medication Instructions:  Continue current medication  *If you need a refill on your cardiac medications before your next appointment, please call your pharmacy*   Lab Work: None ordered   Testing/Procedures: None Ordered   Follow-Up: At [facility], you and your health needs are our priority.  As part of our continuing mission to provide you with exceptional heart care, we have created designated Provider Care Teams.  These Care Teams include your primary Cardiologist (physician) and Advanced Practice Providers (APPs -  Physician Assistants and Nurse Practitioners) who all work together to provide you with the care you need, when you need it.  We recommend signing up for the patient portal called "MyChart".  Sign up information is provided on this After Visit Summary.  MyChart is used to connect with patients for Virtual Visits (Telemedicine).  Patients are able to view lab/test results, encounter notes, upcoming appointments, etc.  Non-urgent messages can be sent to your provider as well.   To learn more about what you can do with MyChart, go to [email or web address].    Your next appointment:   1 year(s)  The format for your next appointment:   In Person  Provider:   You may see Rollene Rotunda, MD or one of the following Advanced Practice Providers on your designated Care Team:    Theodore Demark, PA-C  Joni Reining, DNP, ANP

## 2021-01-22 ENCOUNTER — Other Ambulatory Visit: Payer: Self-pay | Admitting: Cardiology

## 2021-02-09 ENCOUNTER — Other Ambulatory Visit: Payer: Self-pay | Admitting: Cardiology

## 2021-02-13 DIAGNOSIS — H401113 Primary open-angle glaucoma, right eye, severe stage: Secondary | ICD-10-CM | POA: Diagnosis not present

## 2021-03-01 ENCOUNTER — Other Ambulatory Visit: Payer: Self-pay | Admitting: Cardiology

## 2021-04-24 DIAGNOSIS — H401113 Primary open-angle glaucoma, right eye, severe stage: Secondary | ICD-10-CM | POA: Diagnosis not present

## 2021-05-29 ENCOUNTER — Telehealth: Payer: Self-pay | Admitting: Cardiology

## 2021-05-29 NOTE — Telephone Encounter (Signed)
*  STAT* If patient is at the pharmacy, call can be transferred to refill team.   1. Which medications need to be refilled? (please list name of each medication and dose if known)  metoprolol tartrate (LOPRESSOR) 25 MG tablet  2. Which pharmacy/location (including street and city if local pharmacy) is medication to be sent to? [id number] - [place], [place] - [address]  3. Do they need a 30 day or 90 day supply? 30 day supply   Pt is completely out of this medicine

## 2021-06-01 MED ORDER — METOPROLOL TARTRATE 25 MG PO TABS
25.0000 mg | ORAL_TABLET | Freq: Two times a day (BID) | ORAL | 11 refills | Status: DC
Start: 2021-06-01 — End: 2021-07-20

## 2021-06-01 NOTE — Telephone Encounter (Signed)
Pt never got medication refilled and he is still out. Can someone please [facility] and call him when that is done?

## 2021-06-01 NOTE — Telephone Encounter (Signed)
Medication has been refilled. Attempted to call patient, left message for patient to call back to office if needed.

## 2021-06-15 MED ORDER — SILDENAFIL CITRATE 50 MG PO TABS: 50.0000 mg | ORAL_TABLET | Freq: Every day | ORAL | 3 refills | Status: DC | PRN

## 2021-06-15 NOTE — Telephone Encounter (Signed)
Rollene Rotunda, MD to Me     8:54 AM Viagra 50 mg one po PRN daily disp number 15 with 3 refills.

## 2021-06-20 DIAGNOSIS — Z23 Encounter for immunization: Secondary | ICD-10-CM | POA: Diagnosis not present

## 2021-06-26 DIAGNOSIS — Z23 Encounter for immunization: Secondary | ICD-10-CM | POA: Diagnosis not present

## 2021-07-18 ENCOUNTER — Other Ambulatory Visit: Payer: Self-pay | Admitting: Cardiology

## 2021-08-07 DIAGNOSIS — H401113 Primary open-angle glaucoma, right eye, severe stage: Secondary | ICD-10-CM | POA: Diagnosis not present

## 2021-08-07 DIAGNOSIS — H401121 Primary open-angle glaucoma, left eye, mild stage: Secondary | ICD-10-CM | POA: Diagnosis not present

## 2021-09-04 DIAGNOSIS — H401113 Primary open-angle glaucoma, right eye, severe stage: Secondary | ICD-10-CM | POA: Diagnosis not present

## 2021-11-27 ENCOUNTER — Other Ambulatory Visit: Payer: Self-pay | Admitting: Cardiology

## 2022-02-10 ENCOUNTER — Other Ambulatory Visit: Payer: Self-pay | Admitting: Cardiology

## 2022-02-11 DIAGNOSIS — H401132 Primary open-angle glaucoma, bilateral, moderate stage: Secondary | ICD-10-CM | POA: Diagnosis not present

## 2022-02-25 NOTE — Progress Notes (Deleted)
Cardiology Office Note   Date:  02/25/2022   ID:  Bruce Spencer, Bruce Spencer 09/20/1950, MRN [id number]  PCP:  Jarrett Soho, PA-C  Cardiologist:   Rollene Rotunda, MD   No chief complaint on file.     History of Present Illness: Bruce Spencer is a 71 y.o. male who presents follow up of a NQWMI: He had previous stenting to the circumflex.   Since I last saw him ***  *** .  Still working at [employer]  He is not eating what I would like him to eat.  His weight has gone up a little bit.  He has had none of the extreme fatigue that was his anginal. The patient denies any new symptoms such as chest discomfort, neck or arm discomfort. There has been no new shortness of breath, PND or orthopnea. There have been no reported palpitations, presyncope or syncope.    Past Medical History:  Diagnosis Date   CAD (coronary artery disease)    Stent to Circ 2002   Hypertension 2015   Obesity     Past Surgical History:  Procedure Laterality Date   CORONARY STENT INTERVENTION N/A 02/16/2019   Procedure: CORONARY STENT INTERVENTION;  Surgeon: Corky Crafts, MD;  Location: [facility] INVASIVE CV LAB;  Service: Cardiovascular;  Laterality: N/A;   LEFT HEART CATH AND CORONARY ANGIOGRAPHY N/A 02/16/2019   Procedure: LEFT HEART CATH AND CORONARY ANGIOGRAPHY;  Surgeon: Corky Crafts, MD;  Location: [facility]VASIVE CV LAB;  Service: Cardiovascular;  Laterality: N/A;   None       Current Outpatient Medications  Medication Sig Dispense Refill   amLODipine (NORVASC) 5 MG tablet TAKE 1 TABLET EVERY DAY 90 tablet 3   aspirin EC 81 MG EC tablet Take 1 tablet (81 mg total) by mouth daily.     atorvastatin (LIPITOR) 40 MG tablet Take 1 tablet (40 mg total) by mouth daily. NEEDS APPOINTMENT WITH MD FOR FUTURE REFILLS 90 tablet 0   Ferrous Sulfate (IRON PO) Take 1 tablet by mouth. Every 6-8 weeks     metoprolol tartrate (LOPRESSOR) 25 MG tablet TAKE 1 TABLET TWICE DAILY (NEED TO KEEP UPCOMING MD  APPOINTMENT) 180 tablet 2   Multiple Vitamin (MULTIVITAMIN WITH MINERALS) TABS tablet Take 1 tablet by mouth daily.     nitroGLYCERIN (NITROSTAT) 0.4 MG SL tablet Place 1 tablet (0.4 mg total) under the tongue every 5 (five) minutes x 3 doses as needed for chest pain. 25 tablet 1   OVER THE COUNTER MEDICATION Take 1 capsule by mouth 3 (three) times a week. Super Beta Prostate     sildenafil (VIAGRA) 50 MG tablet Take 1 tablet (50 mg total) by mouth daily as needed for erectile dysfunction. 15 tablet 3   No current facility-administered medications for this visit.    Allergies:   Patient has no known allergies.     ROS:  Please see the history of present illness.   Otherwise, review of systems are positive for ***.   All other systems are reviewed and negative.    PHYSICAL EXAM: VS:  There were no vitals taken for this visit. , BMI There is no height or weight on file to calculate BMI. ***     ***GENERAL:  Well appearing NECK:  No jugular venous distention, waveform within normal limits, carotid upstroke brisk and symmetric, no bruits, no thyromegaly LUNGS:  Clear to auscultation bilaterally CHEST:  Unremarkable HEART:  PMI not displaced or sustained,S1 and S2 within  normal limits, no S3, no S4, no clicks, no rubs, no murmurs ABD:  Flat, positive bowel sounds normal in frequency in pitch, no bruits, no rebound, no guarding, no midline pulsatile mass, no hepatomegaly, no splenomegaly EXT:  2 plus pulses throughout, no edema, no cyanosis no clubbing   EKG:  EKG is *** ordered today. The ekg ordered today demonstrates sinus rhythm, rate ***, axis within normal limits, intervals within normal limits, early transition V2, no acute ST-T wave changes.   Recent Labs: No results found for requested labs within last 365 days.    Lipid Panel    Component Value Date/Time   CHOL 144 02/16/2019 0205   TRIG 83 02/16/2019 0205   HDL 48 02/16/2019 0205   CHOLHDL 3.0 02/16/2019 0205   VLDL  17 02/16/2019 0205   LDLCALC 79 02/16/2019 0205      Wt Readings from Last 3 Encounters:  12/25/20 245 lb (111.1 kg)  09/07/19 243 lb (110.2 kg)  03/13/19 231 lb 9.6 oz (105.1 kg)     CATH  02/16/19   Intervention     Other studies Reviewed: Additional studies/ records that were reviewed today include: ***. Review of the above records demonstrates:  Please see elsewhere in the note.     ASSESSMENT AND PLAN:  CAD:  ***  The patient has no new sypmtoms.  No further cardiovascular testing is indicated.  We will continue with aggressive risk reduction and meds as listed.  We talked quite a bit about diet and exercise in the past.  HTN:  The blood pressure is *** at target.    DYSLIPIDEMIA: His LDL is *** 36 with an HDL of 43.  No change in therapy.    Current medicines are reviewed at length with the patient today.  The patient does not have concerns regarding medicines.  The following changes have been made:  ***  Labs/ tests ordered today include:  ***  No orders of the defined types were placed in this encounter.    Disposition:   FU with me in *** months.     Signed, Rollene Rotunda, MD  02/25/2022 8:17 PM    [facility]

## 2022-02-26 ENCOUNTER — Ambulatory Visit: Payer: Medicare HMO | Admitting: Cardiology

## 2022-02-26 DIAGNOSIS — I10 Essential (primary) hypertension: Secondary | ICD-10-CM

## 2022-02-26 DIAGNOSIS — E785 Hyperlipidemia, unspecified: Secondary | ICD-10-CM

## 2022-02-26 DIAGNOSIS — I2510 Atherosclerotic heart disease of native coronary artery without angina pectoris: Secondary | ICD-10-CM

## 2022-04-15 NOTE — Progress Notes (Signed)
Cardiology Office Note   Date:  04/16/2022   ID:  Ramadan, Couey [birth year], MRN [id number]  PCP:  Jarrett Soho, PA-C  Cardiologist:   Rollene Rotunda, MD   Chief Complaint  Patient presents with   Coronary Artery Disease      History of Present Illness: Bruce Spencer is a 71 y.o. male who presents follow up of a NQWMI: He had previous stenting to the circumflex.   Since I last saw him he has done well.  He continues to work at [employer].  He walks with his wife sometimes for exercise.  The patient denies any new symptoms such as chest discomfort, neck or arm discomfort. There has been no new shortness of breath, PND or orthopnea. There have been no reported palpitations, presyncope or syncope.  He unfortunately has gained some weight because he kind of went off the diet he was on.   Past Medical History:  Diagnosis Date   CAD (coronary artery disease)    Stent to Circ 2020   Hypertension 2015   Obesity     Past Surgical History:  Procedure Laterality Date   CORONARY STENT INTERVENTION N/A 02/16/2019   Procedure: CORONARY STENT INTERVENTION;  Surgeon: Corky Crafts, MD;  Location: [facility] INVASIVE CV LAB;  Service: Cardiovascular;  Laterality: N/A;   LEFT HEART CATH AND CORONARY ANGIOGRAPHY N/A 02/16/2019   Procedure: LEFT HEART CATH AND CORONARY ANGIOGRAPHY;  Surgeon: Corky Crafts, MD;  Location[facility] INVASIVE CV LAB;  Service: Cardiovascular;  Laterality: N/A;   None       Current Outpatient Medications  Medication Sig Dispense Refill   amLODipine (NORVASC) 5 MG tablet TAKE 1 TABLET EVERY DAY 90 tablet 3   aspirin EC 81 MG EC tablet Take 1 tablet (81 mg total) by mouth daily.     atorvastatin (LIPITOR) 40 MG tablet Take 1 tablet (40 mg total) by mouth daily. NEEDS APPOINTMENT WITH MD FOR FUTURE REFILLS 90 tablet 0   Ferrous Sulfate (IRON PO) Take 1 tablet by mouth. Every 6-8 weeks     metoprolol tartrate (LOPRESSOR) 25 MG tablet TAKE 1 TABLET TWICE  DAILY (NEED TO KEEP UPCOMING MD APPOINTMENT) 180 tablet 2   Multiple Vitamin (MULTIVITAMIN WITH MINERALS) TABS tablet Take 1 tablet by mouth daily.     OVER THE COUNTER MEDICATION Take 1 capsule by mouth 3 (three) times a week. Super Beta Prostate     sildenafil (VIAGRA) 50 MG tablet Take 1 tablet (50 mg total) by mouth daily as needed for erectile dysfunction. 15 tablet 3   nitroGLYCERIN (NITROSTAT) 0.4 MG SL tablet Place 1 tablet (0.4 mg total) under the tongue every 5 (five) minutes x 3 doses as needed for chest pain. 25 tablet 5   No current facility-administered medications for this visit.    Allergies:   Patient has no known allergies.     ROS:  Please see the history of present illness.   Otherwise, review of systems are positive for none.   All other systems are reviewed and negative.    PHYSICAL EXAM: VS:  BP (!) 141/78   Pulse (!) 59   Ht 5\' 10"  (1.778 m)   Wt 248 lb (112.5 kg)   SpO2 96%   BMI 35.58 kg/m  , BMI Body mass index is 35.58 kg/m. GENERAL:  Well appearing NECK:  No jugular venous distention, waveform within normal limits, carotid upstroke brisk and symmetric, no bruits, no thyromegaly LUNGS:  Clear  to auscultation bilaterally CHEST:  Unremarkable HEART:  PMI not displaced or sustained,S1 and S2 within normal limits, no S3, no S4, no clicks, no rubs, no murmurs ABD:  Flat, positive bowel sounds normal in frequency in pitch, no bruits, no rebound, no guarding, no midline pulsatile mass, no hepatomegaly, no splenomegaly EXT:  2 plus pulses throughout, no edema, no cyanosis no clubbing   EKG:  EKG is   ordered today. The ekg ordered today demonstrates sinus rhythm, rate 56, axis within normal limits, intervals within normal limits, early transition V2, no acute ST-T wave changes.   Recent Labs: No results found for requested labs within last 365 days.    Lipid Panel    Component Value Date/Time   CHOL 144 02/16/2019 0205   TRIG 83 02/16/2019 0205   HDL  48 02/16/2019 0205   CHOLHDL 3.0 02/16/2019 0205   VLDL 17 02/16/2019 0205   LDLCALC 79 02/16/2019 0205      Wt Readings from Last 3 Encounters:  04/16/22 248 lb (112.5 kg)  12/25/20 245 lb (111.1 kg)  09/07/19 243 lb (110.2 kg)     CATH  02/16/19   Intervention     Other studies Reviewed: Additional studies/ records that were reviewed today include: Labs. Review of the above records demonstrates:  Please see elsewhere in the note.     ASSESSMENT AND PLAN:  CAD:   The patient has no new sypmtoms.  No further cardiovascular testing is indicated.  We will continue with aggressive risk reduction and meds as listed.  HTN:  The blood pressure is at target.  No change in therapy.   DYSLIPIDEMIA: His LDL was at target when last checked but he is overdue for follow-up.  He is to have this blood work done soon by his primary provider.    ED:  He is aware not to use Viagra if he has been using SLNGT.  He has not required SLNTG since his procedure.    Current medicines are reviewed at length with the patient today.  The patient does not have concerns regarding medicines.  The following changes have been made:  None  Labs/ tests ordered today include: None  Orders Placed This Encounter  Procedures   EKG 12-Lead     Disposition:   FU with me in 12 months.     Signed, Rollene Rotunda, MD  04/16/2022 3:27 PM    [facility]

## 2022-04-16 ENCOUNTER — Ambulatory Visit: Payer: Medicare HMO | Admitting: Cardiology

## 2022-04-16 ENCOUNTER — Encounter: Payer: Self-pay | Admitting: Cardiology

## 2022-04-16 VITALS — BP 141/78 | HR 59 | Ht 70.0 in | Wt 248.0 lb

## 2022-04-16 DIAGNOSIS — E785 Hyperlipidemia, unspecified: Secondary | ICD-10-CM | POA: Diagnosis not present

## 2022-04-16 DIAGNOSIS — I10 Essential (primary) hypertension: Secondary | ICD-10-CM | POA: Diagnosis not present

## 2022-04-16 DIAGNOSIS — I2510 Atherosclerotic heart disease of native coronary artery without angina pectoris: Secondary | ICD-10-CM

## 2022-04-16 MED ORDER — NITROGLYCERIN 0.4 MG SL SUBL: 0.4000 mg | SUBLINGUAL_TABLET | SUBLINGUAL | 5 refills | Status: DC | PRN

## 2022-04-16 NOTE — Patient Instructions (Signed)

## 2022-05-14 DIAGNOSIS — Z23 Encounter for immunization: Secondary | ICD-10-CM | POA: Diagnosis not present

## 2022-05-24 ENCOUNTER — Other Ambulatory Visit: Payer: Self-pay

## 2022-05-24 MED ORDER — SILDENAFIL CITRATE 50 MG PO TABS: 50.0000 mg | ORAL_TABLET | Freq: Every day | ORAL | 3 refills | Status: DC | PRN

## 2022-06-01 DIAGNOSIS — H401132 Primary open-angle glaucoma, bilateral, moderate stage: Secondary | ICD-10-CM | POA: Diagnosis not present

## 2022-06-14 DIAGNOSIS — Z0100 Encounter for examination of eyes and vision without abnormal findings: Secondary | ICD-10-CM | POA: Diagnosis not present

## 2022-06-29 ENCOUNTER — Other Ambulatory Visit: Payer: Self-pay | Admitting: Cardiology

## 2022-12-14 DIAGNOSIS — I10 Essential (primary) hypertension: Secondary | ICD-10-CM | POA: Diagnosis not present

## 2022-12-14 DIAGNOSIS — R7309 Other abnormal glucose: Secondary | ICD-10-CM | POA: Diagnosis not present

## 2022-12-17 DIAGNOSIS — Z0000 Encounter for general adult medical examination without abnormal findings: Secondary | ICD-10-CM | POA: Diagnosis not present

## 2022-12-17 DIAGNOSIS — R7309 Other abnormal glucose: Secondary | ICD-10-CM | POA: Diagnosis not present

## 2022-12-17 DIAGNOSIS — E1159 Type 2 diabetes mellitus with other circulatory complications: Secondary | ICD-10-CM | POA: Diagnosis not present

## 2022-12-17 DIAGNOSIS — Z23 Encounter for immunization: Secondary | ICD-10-CM | POA: Diagnosis not present

## 2022-12-17 DIAGNOSIS — Z6835 Body mass index (BMI) 35.0-35.9, adult: Secondary | ICD-10-CM | POA: Diagnosis not present

## 2022-12-17 DIAGNOSIS — I2510 Atherosclerotic heart disease of native coronary artery without angina pectoris: Secondary | ICD-10-CM | POA: Diagnosis not present

## 2022-12-17 DIAGNOSIS — I10 Essential (primary) hypertension: Secondary | ICD-10-CM | POA: Diagnosis not present

## 2023-01-04 ENCOUNTER — Other Ambulatory Visit: Payer: Self-pay | Admitting: Cardiology

## 2023-01-05 ENCOUNTER — Other Ambulatory Visit: Payer: Self-pay

## 2023-01-05 MED ORDER — SILDENAFIL CITRATE 50 MG PO TABS: 50.0000 mg | ORAL_TABLET | Freq: Every day | ORAL | 3 refills | Status: DC | PRN

## 2023-01-11 DIAGNOSIS — H401132 Primary open-angle glaucoma, bilateral, moderate stage: Secondary | ICD-10-CM | POA: Diagnosis not present

## 2023-02-12 ENCOUNTER — Other Ambulatory Visit: Payer: Self-pay | Admitting: Cardiology

## 2023-04-08 DIAGNOSIS — H401132 Primary open-angle glaucoma, bilateral, moderate stage: Secondary | ICD-10-CM | POA: Diagnosis not present

## 2023-04-28 ENCOUNTER — Other Ambulatory Visit: Payer: Self-pay | Admitting: Cardiology

## 2023-05-20 ENCOUNTER — Ambulatory Visit: Payer: Medicare HMO | Admitting: Cardiology

## 2023-07-06 ENCOUNTER — Other Ambulatory Visit: Payer: Self-pay | Admitting: Cardiology

## 2023-07-14 NOTE — Progress Notes (Deleted)
  Cardiology Office Note:   Date:  07/14/2023  ID:  Bruce, Spencer July 29, 1951, MRN [id number] PCP: Jarrett Soho, PA-C  [facility] Providers Cardiologist:  Rollene Rotunda, MD {  History of Present Illness:   Bruce Spencer is a 72 y.o. male who presents follow up of a NQWMI: He had previous stenting to the circumflex.   Since I last saw him ***   ***  he has done well.  He continues to work at [employer].  He walks with his wife sometimes for exercise.  The patient denies any new symptoms such as chest discomfort, neck or arm discomfort. There has been no new shortness of breath, PND or orthopnea. There have been no reported palpitations, presyncope or syncope.  He unfortunately has gained some weight because he kind of went off the diet he was on.   ROS: ***  Studies Reviewed:    EKG:       ***  Risk Assessment/Calculations:   {Does this patient have ATRIAL FIBRILLATION?:[id number]} No BP recorded.  {Refresh Note OR Click here to enter BP  :1}***        Physical Exam:   VS:  There were no vitals taken for this visit.   Wt Readings from Last 3 Encounters:  04/16/22 248 lb (112.5 kg)  12/25/20 245 lb (111.1 kg)  09/07/19 243 lb (110.2 kg)     GEN: Well nourished, well developed in no acute distress NECK: No JVD; No carotid bruits CARDIAC: ***RR, *** murmurs, rubs, gallops RESPIRATORY:  Clear to auscultation without rales, wheezing or rhonchi  ABDOMEN: Soft, non-tender, non-distended EXTREMITIES:  No edema; No deformity   ASSESSMENT AND PLAN:   CAD:  *** The patient has no new sypmtoms.  No further cardiovascular testing is indicated.  We will continue with aggressive risk reduction and meds as listed.   HTN:  The blood pressure is ***  at target.  No change in therapy.    DYSLIPIDEMIA: His LDL was ***  at target when last checked but he is overdue for follow-up.  He is to have this blood work done soon by his primary provider.     ED:  ***  He is  aware not to use Viagra if he has been using SLNGT.  He has not required SLNTG since his procedure.      Follow up ***  Signed, Rollene Rotunda, MD

## 2023-07-15 ENCOUNTER — Ambulatory Visit: Payer: Medicare HMO | Admitting: Cardiology

## 2023-07-15 DIAGNOSIS — E785 Hyperlipidemia, unspecified: Secondary | ICD-10-CM

## 2023-07-15 DIAGNOSIS — I10 Essential (primary) hypertension: Secondary | ICD-10-CM

## 2023-07-15 DIAGNOSIS — I2510 Atherosclerotic heart disease of native coronary artery without angina pectoris: Secondary | ICD-10-CM

## 2023-08-23 ENCOUNTER — Encounter (HOSPITAL_COMMUNITY): Payer: Self-pay

## 2023-08-23 ENCOUNTER — Ambulatory Visit (HOSPITAL_COMMUNITY)
Admission: EM | Admit: 2023-08-23 | Discharge: 2023-08-23 | Disposition: A | Discharge status: Home / Self Care | Payer: Medicare HMO | Attending: Family Medicine | Admitting: Family Medicine

## 2023-08-23 ENCOUNTER — Ambulatory Visit (INDEPENDENT_AMBULATORY_CARE_PROVIDER_SITE_OTHER): Payer: Medicare HMO

## 2023-08-23 DIAGNOSIS — R319 Hematuria, unspecified: Secondary | ICD-10-CM | POA: Diagnosis not present

## 2023-08-23 DIAGNOSIS — R310 Gross hematuria: Secondary | ICD-10-CM | POA: Diagnosis not present

## 2023-08-23 DIAGNOSIS — N410 Acute prostatitis: Secondary | ICD-10-CM | POA: Insufficient documentation

## 2023-08-23 DIAGNOSIS — R300 Dysuria: Secondary | ICD-10-CM | POA: Insufficient documentation

## 2023-08-23 DIAGNOSIS — N4281 Prostatodynia syndrome: Secondary | ICD-10-CM | POA: Diagnosis not present

## 2023-08-23 DIAGNOSIS — R109 Unspecified abdominal pain: Secondary | ICD-10-CM

## 2023-08-23 DIAGNOSIS — R1032 Left lower quadrant pain: Secondary | ICD-10-CM | POA: Insufficient documentation

## 2023-08-23 DIAGNOSIS — N401 Enlarged prostate with lower urinary tract symptoms: Secondary | ICD-10-CM | POA: Diagnosis not present

## 2023-08-23 LAB — COMPREHENSIVE METABOLIC PANEL
ALT: 17 U/L (ref 0–44)
AST: 25 U/L (ref 15–41)
Albumin: 3.4 g/dL — ABNORMAL LOW (ref 3.5–5.0)
Alkaline Phosphatase: 62 U/L (ref 38–126)
Anion gap: 9 (ref 5–15)
BUN: 13 mg/dL (ref 8–23)
CO2: 25 mmol/L (ref 22–32)
Calcium: 9.1 mg/dL (ref 8.9–10.3)
Chloride: 105 mmol/L (ref 98–111)
Creatinine, Ser: 0.99 mg/dL (ref 0.61–1.24)
GFR, Estimated: >60 mL/min (ref >60)
Glucose, Bld: 91 mg/dL (ref 70–99)
Potassium: 4.8 mmol/L (ref 3.5–5.1)
Sodium: 139 mmol/L (ref 135–145)
Total Bilirubin: 0.8 mg/dL (ref 0.0–1.2)
Total Protein: 6.8 g/dL (ref 6.5–8.1)

## 2023-08-23 LAB — POCT URINALYSIS DIP (MANUAL ENTRY)
Bilirubin, UA: NEGATIVE
Glucose, UA: NEGATIVE mg/dL
Ketones, POC UA: NEGATIVE mg/dL
Nitrite, UA: NEGATIVE
Protein Ur, POC: >=300 mg/dL — AB
Spec Grav, UA: 1.025 (ref 1.010–1.025)
Urobilinogen, UA: 0.2 U/dL
pH, UA: 5.5 (ref 5.0–8.0)

## 2023-08-23 MED ORDER — TAMSULOSIN HCL 0.4 MG PO CAPS: 0.4000 mg | ORAL_CAPSULE | Freq: Every day | ORAL | 0 refills | Status: AC

## 2023-08-23 MED ORDER — SULFAMETHOXAZOLE-TRIMETHOPRIM 800-160 MG PO TABS: 1.0000 | ORAL_TABLET | Freq: Two times a day (BID) | ORAL | 0 refills | Status: AC

## 2023-08-23 NOTE — ED Provider Notes (Signed)
 [facility]    CSN: [id number] Arrival date & time: 08/23/23  1537      History   Chief Complaint Chief Complaint  Patient presents with   Hematuria    HPI Bruce Spencer is a 72 y.o. male.   Here with complaint of urgency and frequency of urination for 2 to 3 days.  He has had some burning with urination in the last day or 2.  Today he noticed visible blood in his urine and as the day went on he noticed some little clots.  He is not on medication for his prostate but does take over-the-counter super beta prostate pills.  He denies any family history of prostate cancer.  He reports some urinary hesitancy, frequency, intermittency.  He has not been diagnosed with BPH previously.  The patient believes that he had a PSA in the last year with his PCP and that it was normal.  Reviewed the medical records and no records from his PCP and no evidence of a PSA available in our chart.  He also believes that he saw urologist at some point in the last couple of years but does not remember who he saw.   Hematuria Pertinent negatives include no chest pain, no abdominal pain and no shortness of breath.    Past Medical History:  Diagnosis Date   CAD (coronary artery disease)    Stent to Circ 2020   Hypertension 2015   Obesity     Patient Active Problem List   Diagnosis Date Noted   Dyslipidemia 12/24/2020   Coronary artery disease involving native coronary artery of native heart without angina pectoris 09/06/2019   Educated about COVID-19 virus infection 09/06/2019   Pre-diabetes 02/17/2019   Chest pain at rest 02/15/2019   Essential hypertension 02/15/2019   Non-ST elevation (NSTEMI) myocardial infarction (HCC) 02/15/2019   Chest pain 02/15/2019    Past Surgical History:  Procedure Laterality Date   CORONARY STENT INTERVENTION N/A 02/16/2019   Procedure: CORONARY STENT INTERVENTION;  Surgeon: Dann Candyce RAMAN, MD;  [facility]ASIVE CV [facility];  Service:  Cardiovascular;  Laterality: N/A;   LEFT HEART CATH AND CORONARY ANGIOGRAPHY N/A 02/16/2019   Procedure: LEFT HEART CATH AND CORONARY ANGIOGRAPHY;  Surgeon: Dann Candyce RAMAN, MD;  Location: [facility] INVASIVE CV LAB;  Service: Cardiovascular;  Laterality: N/A;   None         Home Medications    Prior to Admission medications   Medication Sig Start Date End Date Taking? Authorizing Provider  sulfamethoxazole -trimethoprim  (BACTRIM  DS) 800-160 MG tablet Take 1 tablet by mouth 2 (two) times daily for 14 days. 08/23/23 09/06/23 Yes Ival Domino, FNP  tamsulosin  (FLOMAX ) 0.4 MG CAPS capsule Take 1 capsule (0.4 mg total) by mouth daily after supper. 08/23/23 09/22/23 Yes Ival Domino, FNP  amLODipine  (NORVASC ) 5 MG tablet TAKE 1 TABLET EVERY DAY 04/28/23   Lavona Agent, MD  aspirin  EC 81 MG EC tablet Take 1 tablet (81 mg total) by mouth daily. 02/18/19   Goodrich, Callie E, PA-C  atorvastatin  (LIPITOR) 40 MG tablet TAKE 1 TABLET EVERY DAY (NEED MD APPOINTMENT FOR REFILLS) 07/07/23   Lavona Agent, MD  Ferrous Sulfate (IRON PO) Take 1 tablet by mouth. Every 6-8 weeks    [provider]  metoprolol  tartrate (LOPRESSOR ) 25 MG tablet TAKE 1 TABLET TWICE DAILY (NEED TO KEEP UPCOMING MD APPOINTMENT) 07/07/23   Lavona Agent, MD  Multiple Vitamin (MULTIVITAMIN WITH MINERALS) TABS tablet Take 1 tablet by mouth daily.  [provider]  nitroGLYCERIN  (NITROSTAT ) 0.4 MG SL tablet Place 1 tablet (0.4 mg total) under the tongue every 5 (five) minutes x 3 doses as needed for chest pain. 04/16/22   Lavona Agent, MD  OVER THE COUNTER MEDICATION Take 1 capsule by mouth 3 (three) times a week. Super Beta Prostate    [provider]  sildenafil  (VIAGRA ) 50 MG tablet Take 1 tablet (50 mg total) by mouth daily as needed for erectile dysfunction. 01/05/23   Lavona Agent, MD    Family History Family History  Problem Relation Age of Onset   Heart failure Mother        died at 56    Aneurysm Father 74    Social History Social History   Tobacco Use   Smoking status: Never   Smokeless tobacco: Never  Substance Use Topics   Alcohol use: Yes    Comment: socially   Drug use: Never     Allergies   Patient has no known allergies.   Review of Systems Review of Systems  Constitutional:  Negative for chills and fever.  HENT:  Negative for ear pain and sore throat.   Eyes:  Negative for pain and visual disturbance.  Respiratory:  Negative for cough and shortness of breath.   Cardiovascular:  Negative for chest pain and palpitations.  Gastrointestinal:  Negative for abdominal pain and vomiting.  Genitourinary:  Positive for difficulty urinating (Hesitancy, frequency, urgency of urination.) and hematuria. Negative for dysuria.  Musculoskeletal:  Negative for arthralgias and back pain.  Skin:  Negative for color change and rash.  Neurological:  Negative for seizures and syncope.  All other systems reviewed and are negative.    Physical Exam Triage Vital Signs ED Triage Vitals  Encounter Vitals Group     BP 08/23/23 1635 (!) 148/74     Systolic BP Percentile --      Diastolic BP Percentile --      Pulse Rate 08/23/23 1634 62     Resp 08/23/23 1634 16     Temp 08/23/23 1634 98.2 F (36.8 C)     Temp Source 08/23/23 1634 Oral     SpO2 08/23/23 1635 93 %     Weight --      Height --      Head Circumference --      Peak Flow --      Pain Score 08/23/23 1633 0     Pain Loc --      Pain Education --      Exclude from Growth Chart --    No data found.  Updated Vital Signs BP (!) 148/74 (BP Location: Right Arm)   Pulse 62   Temp 98.2 F (36.8 C) (Oral)   Resp 16   SpO2 93%   Visual Acuity Right Eye Distance:   Left Eye Distance:   Bilateral Distance:    Right Eye Near:   Left Eye Near:    Bilateral Near:     Physical Exam Vitals and nursing note reviewed. Exam conducted with a chaperone present GREEN Moats RN).  Constitutional:       General: He is not in acute distress.    Appearance: He is well-developed.  HENT:     Head: Normocephalic and atraumatic.     Right Ear: Hearing, tympanic membrane, ear canal and external ear normal.     Left Ear: Hearing, tympanic membrane, ear canal and external ear normal.     Nose: Nose normal. No  mucosal edema, congestion or rhinorrhea.     Mouth/Throat:     Lips: Pink.     Mouth: Mucous membranes are moist.     Pharynx: Uvula midline. No oropharyngeal exudate or posterior oropharyngeal erythema.     Tonsils: No tonsillar exudate.  Eyes:     Conjunctiva/sclera: Conjunctivae normal.     Pupils: Pupils are equal, round, and reactive to light.  Cardiovascular:     Rate and Rhythm: Normal rate and regular rhythm.     Heart sounds: Normal heart sounds, S1 normal and S2 normal. No murmur heard. Pulmonary:     Effort: Pulmonary effort is normal. No respiratory distress.     Breath sounds: Normal breath sounds.  Abdominal:     General: Abdomen is protuberant. Bowel sounds are normal.     Palpations: Abdomen is soft.     Tenderness: There is no abdominal tenderness.  Genitourinary:    Pubic Area: No rash.      Penis: Uncircumcised. No tenderness, discharge, swelling or lesions.      Testes: Normal.        Right: Tenderness, swelling, testicular hydrocele or varicocele not present.        Left: Tenderness, swelling, testicular hydrocele or varicocele not present.     Epididymis:     Right: Not inflamed or enlarged. No mass or tenderness.     Left: Not inflamed or enlarged. No mass or tenderness.     Comments: 40 to 45 g prostate.  Prostate is mildly tender.  Prostate is smooth no nodules. Musculoskeletal:        General: No swelling.     Cervical back: Neck supple.  Lymphadenopathy:     Lower Body: No right inguinal adenopathy. No left inguinal adenopathy.  Skin:    General: Skin is warm and dry.     Capillary Refill: Capillary refill takes less than 2 seconds.     Findings:  Rash present.  Neurological:     Mental Status: He is alert and oriented to person, place, and time.  Psychiatric:        Mood and Affect: Mood normal.      UC Treatments / Results  Labs (all labs ordered are listed, but only abnormal results are displayed) Labs Reviewed  POCT URINALYSIS DIP (MANUAL ENTRY) - Abnormal; Notable for the following components:      Result Value   Color, UA brown (*)    Clarity, UA turbid (*)    Blood, UA large (*)    Protein Ur, POC >=300 (*)    Leukocytes, UA Small (1+) (*)    All other components within normal limits  URINE CULTURE  COMPREHENSIVE METABOLIC PANEL  PSA    EKG   Radiology No results found.  Procedures Procedures (including critical care time)  Medications Ordered in UC Medications - No data to display  Initial Impression / Assessment and Plan / UC Course  I have reviewed the triage vital signs and the nursing notes.  Pertinent labs & imaging results that were available during my care of the patient were reviewed by me and considered in my medical decision making (see chart for details).  Gross hematuria: This may be secondary to acute prostatitis.  Educated patient on the need for a full workup for hematuria to include CT scan and cystoscopy.  Provided information for referral to [facility].  Patient reports she was seen there has been more than 3 years but he will call and make a  follow-up appointment with them.  Abdominal x-ray showed a moderate stool burden but was otherwise negative and did not show stones.  Advised that it will be over read by radiology and if the impression changes he will be contacted.  Otherwise it will be available on the portal for his review.  He is currently on aspirin  81 mg once daily from his cardiologist.  Advised to contact his cardiologist and discuss if it is appropriate to hold the aspirin  at this time.  Advised to continue the aspirin  until he speaks with cardiology in case it is given  for stroke or heart attack prevention.  BPH, prostatodynia, dysuria, abdominal pain and acute prostatitis: Urine is abnormal, culture has been sent.  Will treat with Bactrim  DS, 1, twice daily for 14 days.  Encouraged good fluid intake.  Will start on tamsulosin  0.4 mg, #1, nightly for BPH.  Follow-up if symptoms do not resolve, worsen or new symptoms occur.  Final Clinical Impressions(s) / UC Diagnoses   Final diagnoses:  Gross hematuria  Acute left flank pain  Abdominal pain, LLQ  Dysuria  Prostadynia  Acute prostatitis with hematuria  Benign localized prostatic hyperplasia with lower urinary tract symptoms (LUTS)     Discharge Instructions      Exam and history are most consistent with prostatitis.  UA was abnormal but mostly had hematuria.  Urine culture sent.  PSA and CMP are pending.  Blood work was drawn prior to prostate exam.  Abdominal x-ray shows a moderate stool burden but no sign of kidney stones.  Patient advised that the x-ray will be read by radiology and if that impression changes he will be updated by phone call.  Otherwise he can review the report on the portal.  BPH and Prostatitis: Educated on BPH and prostatitis with handout given.  Will treat with Bactrim  DS, 1, twice daily after food, for 14 days.  Added tamsulosin , 0.4 mg, #1, after evening meal for BPH.  Gross hematuria: Advised that if it is possible to stop his aspirin  he should, but he should ask whoever provides him the instruction to take aspirin  for permission to stop the aspirin .  Specifically if cardiology has asked him to take aspirin  daily he needs to get clearance from cardiology to stop it.  He needs to be referred to urology for full workup of gross hematuria to include CT of abdomen and pelvis without and with IV contrast and a cystoscopy.     ED Prescriptions     Medication Sig Dispense Auth. Provider   sulfamethoxazole -trimethoprim  (BACTRIM  DS) 800-160 MG tablet Take 1 tablet by mouth 2  (two) times daily for 14 days. 28 tablet Kooper Chriswell, FNP   tamsulosin  (FLOMAX ) 0.4 MG CAPS capsule Take 1 capsule (0.4 mg total) by mouth daily after supper. 30 capsule Ival Domino, FNP      PDMP not reviewed this encounter.   Ival Domino, FNP 08/23/23 [phone]

## 2023-08-23 NOTE — Discharge Instructions (Addendum)
 Exam and history are most consistent with prostatitis.  UA was abnormal but mostly had hematuria.  Urine culture sent.  PSA and CMP are pending.  Blood work was drawn prior to prostate exam.  Abdominal x-ray shows a moderate stool burden but no sign of kidney stones.  Patient advised that the x-ray will be read by radiology and if that impression changes he will be updated by phone call.  Otherwise he can review the report on the portal.  BPH and Prostatitis: Educated on BPH and prostatitis with handout given.  Will treat with Bactrim  DS, 1, twice daily after food, for 14 days.  Added tamsulosin , 0.4 mg, #1, after evening meal for BPH.  Gross hematuria: Advised that if it is possible to stop his aspirin  he should, but he should ask whoever provides him the instruction to take aspirin  for permission to stop the aspirin .  Specifically if cardiology has asked him to take aspirin  daily he needs to get clearance from cardiology to stop it.  He needs to be referred to urology for full workup of gross hematuria to include CT of abdomen and pelvis without and with IV contrast and a cystoscopy.

## 2023-08-23 NOTE — ED Triage Notes (Signed)
 Pt states blood in urine with urinary frequency for the past 3 days.

## 2023-08-24 LAB — PSA: Prostatic Specific Antigen: 2.98 ng/mL (ref 0.00–4.00)

## 2023-08-25 LAB — URINE CULTURE: Culture: NO GROWTH

## 2023-08-25 NOTE — Progress Notes (Signed)
 I called the patient and left a voicemail message.  His PSA is upper range of normal but given his diagnosis of acute prostatitis the PSA level is good.  His urine culture is negative.  Advised that urine culture could easily be negative even with acute prostatitis.  Encouraged to complete the sulfa  trimethoprim /Bactrim  DS for the prostatitis.  Encouraged to follow-up with [facility] for workup of gross hematuria.

## 2023-08-25 NOTE — Progress Notes (Signed)
 X-ray was clear.  Patient was updated during his visit and instructed that if there were no changes after radiology reading he would not be called.  He is aware he can check the portal for his results.

## 2023-09-10 ENCOUNTER — Other Ambulatory Visit: Payer: Self-pay | Admitting: Cardiology

## 2023-11-08 DIAGNOSIS — H401132 Primary open-angle glaucoma, bilateral, moderate stage: Secondary | ICD-10-CM | POA: Diagnosis not present

## 2023-11-10 NOTE — Progress Notes (Deleted)
  Cardiology Office Note:   Date:  11/10/2023  ID:  Bruce Spencer [birth year], MRN [id number] PCP: Bruce Soho, PA-C  [facility] Providers Cardiologist:  Rollene Rotunda, MD {  History of Present Illness:   Bruce Spencer is a 73 y.o. male who presents follow up of a NQWMI: He had previous stenting to the circumflex.   Since I last saw him ***   *** he has done well.  He continues to work at [employer].  He walks with his wife sometimes for exercise.  The patient denies any new symptoms such as chest discomfort, neck or arm discomfort. There has been no new shortness of breath, PND or orthopnea. There have been no reported palpitations, presyncope or syncope.  He unfortunately has gained some weight because he kind of went off the diet he was on.   ROS: ***  Studies Reviewed:    EKG:       ***  Risk Assessment/Calculations:   {Does this patient have ATRIAL FIBRILLATION?[phone]} No BP recorded.  {Refresh Note OR Click here to enter BP  :1}***        Physical Exam:   VS:  There were no vitals taken for this visit.   Wt Readings from Last 3 Encounters:  04/16/22 248 lb (112.5 kg)  12/25/20 245 lb (111.1 kg)  09/07/19 243 lb (110.2 kg)     GEN: Well nourished, well developed in no acute distress NECK: No JVD; No carotid bruits CARDIAC: ***RR, *** murmurs, rubs, gallops RESPIRATORY:  Clear to auscultation without rales, wheezing or rhonchi  ABDOMEN: Soft, non-tender, non-distended EXTREMITIES:  No edema; No deformity   ASSESSMENT AND PLAN:   CAD:  ***     The patient has no new sypmtoms.  No further cardiovascular testing is indicated.  We will continue with aggressive risk reduction and meds as listed.   HTN:  The blood pressure is ***  at target.  No change in therapy.    DYSLIPIDEMIA: His LDL was ***   at target when last checked but he is overdue for follow-up.  He is to have this blood work done soon by his primary provider.     ED:  ***  He is  aware not to use Viagra if he has been using SLNGT.  He has not required SLNTG since his procedure.      Follow up ***  Signed, Rollene Rotunda, MD

## 2023-11-11 ENCOUNTER — Ambulatory Visit: Payer: Medicare HMO | Admitting: Cardiology

## 2023-11-11 DIAGNOSIS — I2510 Atherosclerotic heart disease of native coronary artery without angina pectoris: Secondary | ICD-10-CM

## 2023-11-11 DIAGNOSIS — I10 Essential (primary) hypertension: Secondary | ICD-10-CM

## 2023-11-11 DIAGNOSIS — E785 Hyperlipidemia, unspecified: Secondary | ICD-10-CM

## 2023-11-22 ENCOUNTER — Other Ambulatory Visit: Payer: Self-pay | Admitting: Cardiology

## 2024-01-12 DIAGNOSIS — N529 Male erectile dysfunction, unspecified: Secondary | ICD-10-CM | POA: Insufficient documentation

## 2024-01-12 NOTE — Progress Notes (Signed)
  Cardiology Office Note:   Date:  01/13/2024  ID:  Khallid, Pasillas 1951/06/15, MRN [id number] PCP: Darnelle Elders, PA-C  [facility] Providers Cardiologist:  Eilleen Grates, MD {  History of Present Illness:   Bruce Spencer is a 73 y.o. male who presents follow up of a NQWMI: He had previous stenting to the circumflex.    I last saw him in 2023.  He has done well.  The patient denies any new symptoms such as chest discomfort, neck or arm discomfort. There has been no new shortness of breath, PND or orthopnea. There have been no reported palpitations, presyncope or syncope.   He still works at [employer].  In his job he does lifting carrying and walking.  He still does some exercises.  He denies any cardiovascular symptoms.   ROS: As stated in the HPI and negative for all other systems.  Studies Reviewed:    EKG:   EKG Interpretation Date/Time:  Friday Jan 13 2024 15:40:51 EDT Ventricular Rate:  60 PR Interval:  176 QRS Duration:  80 QT Interval:  388 QTC Calculation: 388 R Axis:   -2  Text Interpretation: Sinus rhythm with Premature atrial complexes When compared with ECG of 17-Feb-2019 06:41, No significant change since last tracing Confirmed by Eilleen Grates ([id number]) on 01/13/2024 4:08:04 PM     Risk Assessment/Calculations:         Physical Exam:   VS:  BP 128/76   Pulse 60   Ht 5\' 10"  (1.778 m)   Wt 254 lb 3.2 oz (115.3 kg)   SpO2 94%   BMI 36.47 kg/m    Wt Readings from Last 3 Encounters:  01/13/24 254 lb 3.2 oz (115.3 kg)  04/16/22 248 lb (112.5 kg)  12/25/20 245 lb (111.1 kg)     GEN: Well nourished, well developed in no acute distress NECK: No JVD; No carotid bruits CARDIAC: RRR, no murmurs, rubs, gallops RESPIRATORY:  Clear to auscultation without rales, wheezing or rhonchi  ABDOMEN: Soft, non-tender, non-distended EXTREMITIES:  No edema; No deformity   ASSESSMENT AND PLAN:   CAD:   The patient has no new sypmtoms.  No further  cardiovascular testing is indicated.  We will continue with aggressive risk reduction and meds as listed.   HTN:  The blood pressure is at target.  No change.    DYSLIPIDEMIA: His lipids have not been measured in a while.  I will have him come back for fasting lipid profile and LP(a).  Goal will be an LDL in the 50s.        Follow up in two years with me.   Signed, Eilleen Grates, MD

## 2024-01-13 ENCOUNTER — Encounter: Payer: Self-pay | Admitting: Cardiology

## 2024-01-13 ENCOUNTER — Other Ambulatory Visit (HOSPITAL_COMMUNITY): Payer: Self-pay

## 2024-01-13 ENCOUNTER — Ambulatory Visit: Attending: Cardiology | Admitting: Cardiology

## 2024-01-13 VITALS — BP 128/76 | HR 60 | Ht 70.0 in | Wt 254.2 lb

## 2024-01-13 DIAGNOSIS — E785 Hyperlipidemia, unspecified: Secondary | ICD-10-CM

## 2024-01-13 DIAGNOSIS — I2510 Atherosclerotic heart disease of native coronary artery without angina pectoris: Secondary | ICD-10-CM | POA: Diagnosis not present

## 2024-01-13 DIAGNOSIS — N529 Male erectile dysfunction, unspecified: Secondary | ICD-10-CM

## 2024-01-13 DIAGNOSIS — I10 Essential (primary) hypertension: Secondary | ICD-10-CM

## 2024-01-13 MED ORDER — NITROGLYCERIN 0.4 MG SL SUBL
0.4000 mg | SUBLINGUAL_TABLET | SUBLINGUAL | 3 refills | Status: AC | PRN
  Filled 2024-01-13: qty 25, 10d supply, fill #0

## 2024-01-13 NOTE — Patient Instructions (Addendum)
 Medication Instructions:  No changes *If you need a refill on your cardiac medications before your next appointment, please call your pharmacy*  Lab Work: Return for fasting labs - lipids, Lpa, liver function If you have labs (blood work) drawn today and your tests are completely normal, you will receive your results only by: MyChart Message (if you have MyChart) OR A paper copy in the mail If you have any lab test that is abnormal or we need to change your treatment, we will call you to review the results.  Testing/Procedures: none  Follow-Up: At [facility], you and your health needs are our priority.  As part of our continuing mission to provide you with exceptional heart care, our providers are all part of one team.  This team includes your primary Cardiologist (physician) and Advanced Practice Providers or APPs (Physician Assistants and Nurse Practitioners) who all work together to provide you with the care you need, when you need it.  Your next appointment:   2 year(s)  Provider:   Eilleen Grates, MD    We recommend signing up for the patient portal called "MyChart".  Sign up information is provided on this After Visit Summary.  MyChart is used to connect with patients for Virtual Visits (Telemedicine).  Patients are able to view lab/test results, encounter notes, upcoming appointments, etc.  Non-urgent messages can be sent to your provider as well.   To learn more about what you can do with MyChart, go to [email or web address].   Other Instructions

## 2024-01-21 ENCOUNTER — Other Ambulatory Visit: Payer: Self-pay | Admitting: Cardiology

## 2024-01-30 DIAGNOSIS — Z0100 Encounter for examination of eyes and vision without abnormal findings: Secondary | ICD-10-CM | POA: Diagnosis not present

## 2024-02-16 DIAGNOSIS — H401132 Primary open-angle glaucoma, bilateral, moderate stage: Secondary | ICD-10-CM | POA: Diagnosis not present

## 2024-04-20 DIAGNOSIS — I2510 Atherosclerotic heart disease of native coronary artery without angina pectoris: Secondary | ICD-10-CM | POA: Diagnosis not present

## 2024-04-20 DIAGNOSIS — I10 Essential (primary) hypertension: Secondary | ICD-10-CM | POA: Diagnosis not present

## 2024-04-20 DIAGNOSIS — E1159 Type 2 diabetes mellitus with other circulatory complications: Secondary | ICD-10-CM | POA: Diagnosis not present

## 2024-04-20 DIAGNOSIS — E785 Hyperlipidemia, unspecified: Secondary | ICD-10-CM | POA: Diagnosis not present

## 2024-04-20 DIAGNOSIS — Z13220 Encounter for screening for lipoid disorders: Secondary | ICD-10-CM | POA: Diagnosis not present

## 2024-04-20 DIAGNOSIS — Z6836 Body mass index (BMI) 36.0-36.9, adult: Secondary | ICD-10-CM | POA: Diagnosis not present

## 2024-04-20 DIAGNOSIS — Z0000 Encounter for general adult medical examination without abnormal findings: Secondary | ICD-10-CM | POA: Diagnosis not present

## 2024-04-30 DIAGNOSIS — Z23 Encounter for immunization: Secondary | ICD-10-CM | POA: Diagnosis not present

## 2024-06-17 ENCOUNTER — Other Ambulatory Visit: Payer: Self-pay | Admitting: Cardiology

## 2024-06-18 ENCOUNTER — Other Ambulatory Visit: Payer: Self-pay | Admitting: Cardiology

## 2024-06-22 DIAGNOSIS — H401132 Primary open-angle glaucoma, bilateral, moderate stage: Secondary | ICD-10-CM | POA: Diagnosis not present

## 2024-09-02 ENCOUNTER — Other Ambulatory Visit: Payer: Self-pay | Admitting: Cardiology

## 2024-09-03 NOTE — Telephone Encounter (Signed)
 Pt's pharmacy is requesting a refill on medication sildenafil . Would Dr. Lavona like to refill this medication? Please address
